# Patient Record
Sex: Female | Born: 1995
Health system: Southern US, Community
[De-identification: ages and names within clinical notes are randomized; demographics above are authoritative.]

## PROBLEM LIST (undated history)

## (undated) DIAGNOSIS — F329 Major depressive disorder, single episode, unspecified: Secondary | ICD-10-CM

## (undated) DIAGNOSIS — F32A Depression, unspecified: Secondary | ICD-10-CM

## (undated) DIAGNOSIS — Z789 Other specified health status: Secondary | ICD-10-CM

## (undated) DIAGNOSIS — O24419 Gestational diabetes mellitus in pregnancy, unspecified control: Secondary | ICD-10-CM

## (undated) DIAGNOSIS — G43009 Migraine without aura, not intractable, without status migrainosus: Secondary | ICD-10-CM

## (undated) DIAGNOSIS — D649 Anemia, unspecified: Secondary | ICD-10-CM

## (undated) HISTORY — PX: TONSILLECTOMY: SUR1361

## (undated) HISTORY — PX: BACK SURGERY: SHX140

## (undated) HISTORY — DX: Gestational diabetes mellitus in pregnancy, unspecified control: O24.419

## (undated) HISTORY — DX: Migraine without aura, not intractable, without status migrainosus: G43.009

## (undated) HISTORY — DX: Anemia, unspecified: D64.9

## (undated) HISTORY — DX: Depression, unspecified: F32.A

---
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
## 2016-08-26 NOTE — L&D Delivery Note (Signed)
Delivery Note At 7:26 PM a viable female was delivered via Vaginal, Spontaneous (Presentation: OA ;  ).  APGAR: 8, 8; weight 4 lb 12.5 oz (2170 g).   Placenta status: c/w abruption, otherwise WNL. Large amount of old clot as fetal shoulders expulsed, .  Cord: 3VC with the following complications: placental abruption s/p PPROM.  Cord pH: sent  Anesthesia:  CLE Episiotomy: None Lacerations:  vaginal Suture Repair: 3.0 vicryl Est. Blood Loss (mL):  700cc, mostly s/s abruption   It's a boy - "Alexis Mullins"! Mom to postpartum.  Baby to NICU.  Alexis Mullins 08/25/2017, 8:11 PM

## 2017-02-11 DIAGNOSIS — Z3201 Encounter for pregnancy test, result positive: Secondary | ICD-10-CM | POA: Diagnosis not present

## 2017-02-19 DIAGNOSIS — N911 Secondary amenorrhea: Secondary | ICD-10-CM | POA: Diagnosis not present

## 2017-02-25 LAB — OB RESULTS CONSOLE ABO/RH: RH TYPE: POSITIVE

## 2017-02-25 LAB — OB RESULTS CONSOLE RPR: RPR: NONREACTIVE

## 2017-02-25 LAB — OB RESULTS CONSOLE HEPATITIS B SURFACE ANTIGEN: HEP B S AG: NEGATIVE

## 2017-02-25 LAB — OB RESULTS CONSOLE RUBELLA ANTIBODY, IGM: RUBELLA: IMMUNE

## 2017-02-25 LAB — OB RESULTS CONSOLE GC/CHLAMYDIA
Chlamydia: NEGATIVE
GC PROBE AMP, GENITAL: NEGATIVE

## 2017-02-25 LAB — OB RESULTS CONSOLE HIV ANTIBODY (ROUTINE TESTING): HIV: NONREACTIVE

## 2017-03-03 DIAGNOSIS — N911 Secondary amenorrhea: Secondary | ICD-10-CM | POA: Diagnosis not present

## 2017-03-07 DIAGNOSIS — Z3480 Encounter for supervision of other normal pregnancy, unspecified trimester: Secondary | ICD-10-CM | POA: Diagnosis not present

## 2017-03-07 DIAGNOSIS — Z3A08 8 weeks gestation of pregnancy: Secondary | ICD-10-CM | POA: Diagnosis not present

## 2017-03-07 DIAGNOSIS — Z3491 Encounter for supervision of normal pregnancy, unspecified, first trimester: Secondary | ICD-10-CM | POA: Diagnosis not present

## 2017-03-25 DIAGNOSIS — Z3401 Encounter for supervision of normal first pregnancy, first trimester: Secondary | ICD-10-CM | POA: Diagnosis not present

## 2017-03-25 DIAGNOSIS — Z113 Encounter for screening for infections with a predominantly sexual mode of transmission: Secondary | ICD-10-CM | POA: Diagnosis not present

## 2017-03-25 DIAGNOSIS — Z3480 Encounter for supervision of other normal pregnancy, unspecified trimester: Secondary | ICD-10-CM | POA: Diagnosis not present

## 2017-05-14 DIAGNOSIS — Z363 Encounter for antenatal screening for malformations: Secondary | ICD-10-CM | POA: Diagnosis not present

## 2017-07-07 DIAGNOSIS — Z3480 Encounter for supervision of other normal pregnancy, unspecified trimester: Secondary | ICD-10-CM | POA: Diagnosis not present

## 2017-07-07 DIAGNOSIS — Z23 Encounter for immunization: Secondary | ICD-10-CM | POA: Diagnosis not present

## 2017-07-07 DIAGNOSIS — Z3A26 26 weeks gestation of pregnancy: Secondary | ICD-10-CM | POA: Diagnosis not present

## 2017-07-21 DIAGNOSIS — Z3A28 28 weeks gestation of pregnancy: Secondary | ICD-10-CM | POA: Diagnosis not present

## 2017-07-21 DIAGNOSIS — O99810 Abnormal glucose complicating pregnancy: Secondary | ICD-10-CM | POA: Diagnosis not present

## 2017-08-24 ENCOUNTER — Encounter (HOSPITAL_COMMUNITY): Payer: Self-pay

## 2017-08-24 ENCOUNTER — Inpatient Hospital Stay (HOSPITAL_COMMUNITY)
Admission: AD | Admit: 2017-08-24 | Discharge: 2017-08-27 | DRG: 805 | Disposition: A | Discharge status: Home / Self Care | Payer: BLUE CROSS/BLUE SHIELD | Source: Ambulatory Visit | Attending: Obstetrics and Gynecology | Admitting: Obstetrics and Gynecology

## 2017-08-24 ENCOUNTER — Other Ambulatory Visit: Payer: Self-pay

## 2017-08-24 DIAGNOSIS — O42913 Preterm premature rupture of membranes, unspecified as to length of time between rupture and onset of labor, third trimester: Principal | ICD-10-CM | POA: Diagnosis present

## 2017-08-24 DIAGNOSIS — O42919 Preterm premature rupture of membranes, unspecified as to length of time between rupture and onset of labor, unspecified trimester: Secondary | ICD-10-CM | POA: Diagnosis present

## 2017-08-24 DIAGNOSIS — O4593 Premature separation of placenta, unspecified, third trimester: Secondary | ICD-10-CM | POA: Diagnosis present

## 2017-08-24 DIAGNOSIS — Z3A33 33 weeks gestation of pregnancy: Secondary | ICD-10-CM

## 2017-08-24 DIAGNOSIS — Z880 Allergy status to penicillin: Secondary | ICD-10-CM | POA: Diagnosis not present

## 2017-08-24 DIAGNOSIS — O42013 Preterm premature rupture of membranes, onset of labor within 24 hours of rupture, third trimester: Secondary | ICD-10-CM | POA: Diagnosis not present

## 2017-08-24 DIAGNOSIS — O24420 Gestational diabetes mellitus in childbirth, diet controlled: Secondary | ICD-10-CM | POA: Diagnosis present

## 2017-08-24 DIAGNOSIS — Z3A32 32 weeks gestation of pregnancy: Secondary | ICD-10-CM | POA: Diagnosis not present

## 2017-08-24 DIAGNOSIS — Z363 Encounter for antenatal screening for malformations: Secondary | ICD-10-CM

## 2017-08-24 DIAGNOSIS — Z3A00 Weeks of gestation of pregnancy not specified: Secondary | ICD-10-CM | POA: Diagnosis not present

## 2017-08-24 HISTORY — DX: Other specified health status: Z78.9

## 2017-08-24 LAB — URINALYSIS, ROUTINE W REFLEX MICROSCOPIC
BILIRUBIN URINE: NEGATIVE
Glucose, UA: NEGATIVE mg/dL
KETONES UR: NEGATIVE mg/dL
LEUKOCYTES UA: NEGATIVE
NITRITE: NEGATIVE
Protein, ur: NEGATIVE mg/dL
RBC / HPF: NONE SEEN RBC/hpf (ref 0–5)
Specific Gravity, Urine: 1.003 — ABNORMAL LOW (ref 1.005–1.030)
pH: 7 (ref 5.0–8.0)

## 2017-08-24 LAB — GLUCOSE, CAPILLARY
GLUCOSE-CAPILLARY: 76 mg/dL (ref 65–99)
GLUCOSE-CAPILLARY: 112 mg/dL — AB (ref 65–99)

## 2017-08-24 LAB — CBC
HEMATOCRIT: 34.1 % — AB (ref 36.0–46.0)
HEMOGLOBIN: 11.3 g/dL — AB (ref 12.0–15.0)
MCH: 27.6 pg (ref 26.0–34.0)
MCHC: 33.1 g/dL (ref 30.0–36.0)
MCV: 83.4 fL (ref 78.0–100.0)
Platelets: 173 10*3/uL (ref 150–400)
RBC: 4.09 MIL/uL (ref 3.87–5.11)
RDW: 12.5 % (ref 11.5–15.5)
WBC: 13.6 10*3/uL — AB (ref 4.0–10.5)

## 2017-08-24 LAB — POCT FERN TEST: POCT Fern Test: NEGATIVE

## 2017-08-24 LAB — WET PREP, GENITAL
Clue Cells Wet Prep HPF POC: NONE SEEN
SPERM: NONE SEEN
WBC, Wet Prep HPF POC: NONE SEEN
YEAST WET PREP: NONE SEEN

## 2017-08-24 LAB — AMNISURE RUPTURE OF MEMBRANE (ROM) NOT AT ARMC: Amnisure ROM: POSITIVE

## 2017-08-24 MED ORDER — AZITHROMYCIN 500 MG PO TABS: 500.0000 mg | ORAL_TABLET | Freq: Every day | ORAL | Status: DC

## 2017-08-24 MED ORDER — ACETAMINOPHEN 325 MG PO TABS: 650.0000 mg | ORAL_TABLET | ORAL | Status: DC | PRN

## 2017-08-24 MED ORDER — DOCUSATE SODIUM 100 MG PO CAPS
100.0000 mg | ORAL_CAPSULE | Freq: Every day | ORAL | Status: DC
  Administered 2017-08-24: 100 mg via ORAL
  Filled 2017-08-24: qty 1

## 2017-08-24 MED ORDER — ERYTHROMYCIN BASE 250 MG PO TABS: 250.0000 mg | ORAL_TABLET | Freq: Four times a day (QID) | ORAL | Status: DC

## 2017-08-24 MED ORDER — SODIUM CHLORIDE 0.9 % IV SOLN: 250.0000 mg | Freq: Four times a day (QID) | INTRAVENOUS | Status: DC

## 2017-08-24 MED ORDER — DEXTROSE 5 % IV SOLN
500.0000 mg | INTRAVENOUS | Status: DC
  Administered 2017-08-24: 500 mg via INTRAVENOUS
  Filled 2017-08-24 (×2): qty 500

## 2017-08-24 MED ORDER — SODIUM CHLORIDE 0.9% FLUSH
3.0000 mL | INTRAVENOUS | Status: DC | PRN
  Administered 2017-08-24: 3 mL via INTRAVENOUS
  Filled 2017-08-24: qty 3

## 2017-08-24 MED ORDER — BETAMETHASONE SOD PHOS & ACET 6 (3-3) MG/ML IJ SUSP
12.0000 mg | INTRAMUSCULAR | Status: AC
  Administered 2017-08-24 – 2017-08-25 (×2): 12 mg via INTRAMUSCULAR
  Filled 2017-08-24 (×2): qty 2

## 2017-08-24 MED ORDER — CALCIUM CARBONATE ANTACID 500 MG PO CHEW: 2.0000 | CHEWABLE_TABLET | ORAL | Status: DC | PRN

## 2017-08-24 MED ORDER — SODIUM CHLORIDE 0.9 % IV SOLN: 250.0000 mL | INTRAVENOUS | Status: DC | PRN

## 2017-08-24 MED ORDER — NIFEDIPINE 10 MG PO CAPS
10.0000 mg | ORAL_CAPSULE | ORAL | Status: DC | PRN
  Administered 2017-08-24 – 2017-08-25 (×2): 10 mg via ORAL
  Filled 2017-08-24 (×2): qty 1

## 2017-08-24 MED ORDER — ZOLPIDEM TARTRATE 5 MG PO TABS
5.0000 mg | ORAL_TABLET | Freq: Every evening | ORAL | Status: DC | PRN
Start: 2017-08-24 — End: 2017-08-25

## 2017-08-24 MED ORDER — PRENATAL MULTIVITAMIN CH
1.0000 | ORAL_TABLET | Freq: Every day | ORAL | Status: DC
  Administered 2017-08-24: 1 via ORAL
  Filled 2017-08-24: qty 1

## 2017-08-24 MED ORDER — SODIUM CHLORIDE 0.9% FLUSH: 3.0000 mL | Freq: Two times a day (BID) | INTRAVENOUS | Status: DC

## 2017-08-24 NOTE — Consult Note (Signed)
[facility]  [facility] 08/24/2017    10:56 PM  Neonatal Medicine Consultation         Kris MoutonBrooke Yore          MRN:  [id number]  I was called at the request of the patient's obstetrician (Dr. Langston MaskerMorris) to speak to this patient due to prematurity and premature ROM at 3432 6/7 weeks (today).  The patient's prenatal course includes A1DM with good control, otherwise she has been doing well until today.  She is 32 6/7 weeks currently.  She is admitted to antenatal unit, and is receiving treatment that includes latency antibiotics, betamethasone, and nifedipine.  GBS testing is pending.  The baby is a boy.  I reviewed expectations for a baby born at 32+ weeks, including survival, length of stay, morbidities such as respiratory distress, infection, feeding intolerance.  I described how we provide respiratory and feeding support.  Mom plans to breast feed, which I encouraged as best for the baby (with supplementations for needed calories).  I let mom know that the baby's outlook generally improves the longer she remains undelivered.  I spent 30 minutes reviewing the record, speaking to the patient, and entering appropriate documentation.  More than 50% of the time was spent face to face with patient.   _____________________ Electronically Signed By: Angelita InglesMcCrae S. Malayshia All, MD Neonatologist

## 2017-08-24 NOTE — Progress Notes (Signed)
Sterile speculum exam done to obtain specimens for wet prep and group B strep. Cervix appears visually closed on speculum exam.

## 2017-08-24 NOTE — MAU Provider Note (Signed)
Chief Complaint:  Rupture of Membranes   First Provider Initiated Contact with Patient 08/24/17 1102      HPI: Alexis Mullins is a 21 y.o. G1P0 at 9264w6d who presents to maternity admissions reporting rupture of membranes. She reports waking up at 430am with underwear soaking wet she changed clothes and went back to sleep, woke up again about an hour later soaking wet again. She reports good fetal movement, vaginal bleeding, vaginal itching/burning, urinary symptoms, h/a, dizziness, n/v, or fever/chills.    Past Medical History: Past Medical History:  Diagnosis Date  . Medical history non-contributory     Past obstetric history: OB History  Gravida Para Term Preterm AB Living  1            SAB TAB Ectopic Multiple Live Births               # Outcome Date GA Lbr Len/2nd Weight Sex Delivery Anes PTL Lv  1 Current               Past Surgical History: Past Surgical History:  Procedure Laterality Date  . BACK SURGERY    . TONSILLECTOMY      Family History: History reviewed. No pertinent family history.  Social History: Social History   Tobacco Use  . Smoking status: Never Smoker  . Smokeless tobacco: Never Used  Substance Use Topics  . Alcohol use: No    Frequency: Never  . Drug use: No    Allergies:  Allergies  Allergen Reactions  . Penicillins Hives and Swelling    Has patient had a PCN reaction causing immediate rash, facial/tongue/throat swelling, SOB or lightheadedness with hypotension: Yes Has patient had a PCN reaction causing severe rash involving mucus membranes or skin necrosis: Yes Has patient had a PCN reaction that required hospitalization: No Has patient had a PCN reaction occurring within the last 10 years: No If all of the above answers are "NO", then may proceed with Cephalosporin use.     Meds:  Medications Prior to Admission  Medication Sig Dispense Refill Last Dose  . Prenatal Vit-Fe Fumarate-FA (PRENATAL MULTIVITAMIN) TABS tablet Take 1  tablet by mouth daily at 12 noon.   08/23/2017 at Unknown time    ROS:  Review of Systems  Constitutional: Negative.   Respiratory: Negative.   Cardiovascular: Negative.   Gastrointestinal: Negative.   Genitourinary: Negative for difficulty urinating, dysuria, frequency, pelvic pain, urgency, vaginal bleeding, vaginal discharge and vaginal pain.       Leaking fluid   Musculoskeletal: Negative.   Skin: Negative.   Neurological: Negative.   Psychiatric/Behavioral: Negative.    I have reviewed patient's Past Medical Hx, Surgical Hx, Family Hx, Social Hx, medications and allergies.   Physical Exam   Patient Vitals for the past 24 hrs:  BP Temp Temp src Pulse Resp SpO2  08/24/17 1153 115/69 98.1 F (36.7 C) Oral (!) 112 17 95 %  08/24/17 1040 118/77 98.5 F (36.9 C) Oral (!) 111 18 -   Constitutional: Well-developed, well-nourished female in no acute distress.  Cardiovascular: normal rate Respiratory: normal effort GI: Abd soft, non-tender, gravid appropriate for gestational age.  MS: Extremities nontender, no edema, normal ROM Neurologic: Alert and oriented x 4.  GU: Neg CVAT.  Cervical exam: Dilation: Closed(fingertip externally) Effacement (%): Thick Cervical Position: Posterior Station: -3 Exam by:: v Will Schier cnm    FHT:  Baseline 135 , moderate variability, accelerations present, no decelerations Contractions: 2 UC/ mild with UI  Labs: Results for orders placed or performed during the hospital encounter of 08/24/17 (from the past 24 hour(s))  Amnisure rupture of membrane (rom)not at [facility]     Status: None   Collection Time: 08/24/17 10:51 AM  Result Value Ref Range   Amnisure ROM POSITIVE   POCT fern test     Status: None   Collection Time: 08/24/17 11:07 AM  Result Value Ref Range   POCT Fern Test Negative = intact amniotic membranes      MAU Course/MDM: Orders Placed This Encounter  Procedures  . Amnisure [facility] (rom[facility]not [facility]  .  Encourage fluids  . POCT fern test    Meds ordered this encounter  Medications  . NIFEdipine (PROCARDIA) capsule 10 mg  . betamethasone acetate-betamethasone sodium phosphate (CELESTONE) injection 12 mg   NST reviewed Consult Dr Langston MaskerMorris with presentation, exam findings and test results.  Treatments in MAU included one dose of Procardia 10mg  and oral pitcher of fluid.  NICU called for admission notification- Dr Eulah PontMurphy agrees with POC and [facility]   Assessment: 1. Preterm premature rupture of membranes (PPROM) with unknown onset of labor    Plan: Admission to Ante, care transferred to Dr Langston MaskerMorris Orders placed by Dr Langston MaskerMorris    Steward DroneVeronica Aaliyan Brinkmeier Certified Nurse-Midwife 08/24/2017 11:12 AM

## 2017-08-24 NOTE — H&P (Addendum)
Alexis MoutonBrooke Mullins is a 21 y.o. female presenting for PPROM at 4am today; amnisure positive in MAU.  She patient reports clear fluid.  Active FM.  No VB or CTX. CVX closed. Antepartum course complicated by A1DM with good control.  The patient has h/o anemia requiring blood transfusion; stable with pregnancy.    OB History    Gravida Para Term Preterm AB Living   1             SAB TAB Ectopic Multiple Live Births                 Past Medical History:  Diagnosis Date  . Medical history non-contributory    Past Surgical History:  Procedure Laterality Date  . BACK SURGERY    . TONSILLECTOMY     Family History: family history is not on file. Social History:  reports that  has never smoked. she has never used smokeless tobacco. She reports that she does not drink alcohol or use drugs.     Maternal Diabetes: Yes:  Diabetes Type:  Diet controlled Genetic Screening: Normal Maternal Ultrasounds/Referrals: Normal Fetal Ultrasounds or other Referrals:  None Maternal Substance Abuse:  No Significant Maternal Medications:  None Significant Maternal Lab Results:  None Other Comments:  None  ROS Maternal Medical History:  Reason for admission: Rupture of membranes.   Contractions: Frequency: rare.   Perceived severity is mild.    Fetal activity: Perceived fetal activity is normal.   Last perceived fetal movement was within the past hour.    Prenatal complications: no prenatal complications Prenatal Complications - Diabetes: gestational. Diabetes is managed by diet.      Dilation: Closed(fingertip externally) Effacement (%): Thick Station: -3 Exam by:: v rogers cnm Blood pressure 115/69, pulse (!) 112, temperature 98.1 F (36.7 C), temperature source Oral, resp. rate 17, SpO2 95 %. Maternal Exam:  Uterine Assessment: Contraction strength is mild.  Contraction frequency is rare.   Abdomen: Fundal height is c/w dates.       Physical Exam  Constitutional: She is oriented to  person, place, and time. She appears well-developed and well-nourished.  GI: Soft. There is no tenderness. There is no rebound and no guarding.  Neurological: She is alert and oriented to person, place, and time.  Skin: Skin is warm and dry.  Psychiatric: She has a normal mood and affect. Her behavior is normal.    Prenatal labs: ABO, Rh:   Antibody:   Rubella:   RPR:    HBsAg:    HIV:    GBS:     Assessment/Plan: 21yo G1 at 5125w6d with PPROM -Latency antibiotics (Azith only; allergy to PCN) -BMZ -GBS, GC/C, urinalysis pending -SCDs -MFM ultrasound -NICU consult -Monitor for s/sx chorio, labor, abruption -CEFM x 24 hours  Alexis Mullins 08/24/2017, 2:10 PM

## 2017-08-24 NOTE — Progress Notes (Addendum)
2115: Dr Katrinka BlazingSmith at bedside communicating with patient and her spouse. All questions were answered. Pt pass a small clot with leaking.  0115: Dr Langston MaskerMorris called and informed of patient's vaginal bleed with a few small clots, the largest clot approximately 1/2 dollar size. FHR 120-110 with good excels and occasional contractions which is sometimes felt and other times not felt. Order received to start LR @75  mls/hr and call MD if anything changes.   0138: LR 250 ml bolus administered as ordered.  [id number]: Procardia 10 mg po administered for preterm contractions.   0630: No new vaginal bleed noted or reported. Contraction decrease and patient reported 'feeling ok". Dr Langston MaskerMorris called and was updated.  0700: Pt off unit for MFM US.

## 2017-08-24 NOTE — MAU Note (Signed)
Woke up during the night and states underwear was soaked. Changed and went back to bed- underwear soaked through again. Clear fluid with pink tinge. Some clear mucous. Feeling some cramping. Some decreased movement today

## 2017-08-25 ENCOUNTER — Inpatient Hospital Stay (HOSPITAL_COMMUNITY): Payer: BLUE CROSS/BLUE SHIELD | Admitting: Anesthesiology

## 2017-08-25 ENCOUNTER — Encounter (HOSPITAL_COMMUNITY): Payer: Self-pay | Admitting: Obstetrics and Gynecology

## 2017-08-25 ENCOUNTER — Inpatient Hospital Stay (HOSPITAL_COMMUNITY): Payer: BLUE CROSS/BLUE SHIELD

## 2017-08-25 DIAGNOSIS — Z363 Encounter for antenatal screening for malformations: Secondary | ICD-10-CM

## 2017-08-25 DIAGNOSIS — Z3A33 33 weeks gestation of pregnancy: Secondary | ICD-10-CM

## 2017-08-25 LAB — CBC
HCT: 32.1 % — ABNORMAL LOW (ref 36.0–46.0)
HEMOGLOBIN: 10.5 g/dL — AB (ref 12.0–15.0)
MCH: 27.4 pg (ref 26.0–34.0)
MCHC: 32.7 g/dL (ref 30.0–36.0)
MCV: 83.8 fL (ref 78.0–100.0)
Platelets: 166 10*3/uL (ref 150–400)
RBC: 3.83 MIL/uL — ABNORMAL LOW (ref 3.87–5.11)
RDW: 12.4 % (ref 11.5–15.5)
WBC: 19.7 10*3/uL — ABNORMAL HIGH (ref 4.0–10.5)

## 2017-08-25 LAB — PLATELET COUNT: PLATELETS: 171 10*3/uL (ref 150–400)

## 2017-08-25 LAB — GC/CHLAMYDIA PROBE AMP (~~LOC~~) NOT AT ARMC
CHLAMYDIA, DNA PROBE: NEGATIVE
NEISSERIA GONORRHEA: NEGATIVE

## 2017-08-25 LAB — GLUCOSE, CAPILLARY
GLUCOSE-CAPILLARY: 100 mg/dL — AB (ref 65–99)
GLUCOSE-CAPILLARY: 115 mg/dL — AB (ref 65–99)
GLUCOSE-CAPILLARY: 97 mg/dL (ref 65–99)
Glucose-Capillary: 127 mg/dL — ABNORMAL HIGH (ref 65–99)

## 2017-08-25 LAB — PROTIME-INR
INR: 1.08
Prothrombin Time: 13.9 seconds (ref 11.4–15.2)

## 2017-08-25 LAB — APTT: aPTT: 27 seconds (ref 24–36)

## 2017-08-25 LAB — SAVE SMEAR

## 2017-08-25 LAB — FIBRINOGEN: Fibrinogen: 478 mg/dL — ABNORMAL HIGH (ref 210–475)

## 2017-08-25 MED ORDER — WITCH HAZEL-GLYCERIN EX PADS
1.0000 | MEDICATED_PAD | CUTANEOUS | Status: DC | PRN
Start: 2017-08-25 — End: 2017-08-27

## 2017-08-25 MED ORDER — LIDOCAINE HCL (PF) 1 % IJ SOLN
30.0000 mL | INTRAMUSCULAR | Status: DC | PRN
  Filled 2017-08-25: qty 30

## 2017-08-25 MED ORDER — COCONUT OIL OIL: 1.0000 "application " | TOPICAL_OIL | Status: DC | PRN

## 2017-08-25 MED ORDER — OXYCODONE-ACETAMINOPHEN 5-325 MG PO TABS: 1.0000 | ORAL_TABLET | ORAL | Status: DC | PRN

## 2017-08-25 MED ORDER — SIMETHICONE 80 MG PO CHEW: 80.0000 mg | CHEWABLE_TABLET | ORAL | Status: DC | PRN

## 2017-08-25 MED ORDER — OXYTOCIN 40 UNITS IN LACTATED RINGERS INFUSION - SIMPLE MED
1.0000 m[IU]/min | INTRAVENOUS | Status: DC
  Administered 2017-08-25: 2 m[IU]/min via INTRAVENOUS
  Filled 2017-08-25: qty 1000

## 2017-08-25 MED ORDER — PRENATAL MULTIVITAMIN CH
1.0000 | ORAL_TABLET | Freq: Every day | ORAL | Status: DC
  Administered 2017-08-26: 1 via ORAL
  Filled 2017-08-25: qty 1

## 2017-08-25 MED ORDER — LACTATED RINGERS IV SOLN: INTRAVENOUS | Status: DC

## 2017-08-25 MED ORDER — EPHEDRINE 5 MG/ML INJ
10.0000 mg | INTRAVENOUS | Status: DC | PRN
  Filled 2017-08-25: qty 2

## 2017-08-25 MED ORDER — VANCOMYCIN HCL IN DEXTROSE 1-5 GM/200ML-% IV SOLN
1000.0000 mg | Freq: Two times a day (BID) | INTRAVENOUS | Status: DC
  Administered 2017-08-25 – 2017-08-26 (×2): 1000 mg via INTRAVENOUS
  Filled 2017-08-25 (×4): qty 200

## 2017-08-25 MED ORDER — FLEET ENEMA 7-19 GM/118ML RE ENEM: 1.0000 | ENEMA | RECTAL | Status: DC | PRN

## 2017-08-25 MED ORDER — LACTATED RINGERS IV SOLN
INTRAVENOUS | Status: DC
  Administered 2017-08-25 (×2): via INTRAVENOUS

## 2017-08-25 MED ORDER — ONDANSETRON HCL 4 MG PO TABS: 4.0000 mg | ORAL_TABLET | ORAL | Status: DC | PRN

## 2017-08-25 MED ORDER — ONDANSETRON HCL 4 MG/2ML IJ SOLN: 4.0000 mg | Freq: Four times a day (QID) | INTRAMUSCULAR | Status: DC | PRN

## 2017-08-25 MED ORDER — BENZOCAINE-MENTHOL 20-0.5 % EX AERO
1.0000 "application " | INHALATION_SPRAY | CUTANEOUS | Status: DC | PRN
  Administered 2017-08-26: 1 via TOPICAL
  Filled 2017-08-25: qty 56

## 2017-08-25 MED ORDER — DIPHENHYDRAMINE HCL 25 MG PO CAPS: 25.0000 mg | ORAL_CAPSULE | Freq: Four times a day (QID) | ORAL | Status: DC | PRN

## 2017-08-25 MED ORDER — OXYCODONE HCL 5 MG PO TABS: 10.0000 mg | ORAL_TABLET | ORAL | Status: DC | PRN

## 2017-08-25 MED ORDER — PHENYLEPHRINE 40 MCG/ML (10ML) SYRINGE FOR IV PUSH (FOR BLOOD PRESSURE SUPPORT)
80.0000 ug | PREFILLED_SYRINGE | INTRAVENOUS | Status: DC | PRN
  Filled 2017-08-25: qty 5
  Filled 2017-08-25: qty 10

## 2017-08-25 MED ORDER — LACTATED RINGERS IV SOLN: 500.0000 mL | INTRAVENOUS | Status: DC | PRN

## 2017-08-25 MED ORDER — FENTANYL CITRATE (PF) 100 MCG/2ML IJ SOLN
100.0000 ug | Freq: Once | INTRAMUSCULAR | Status: AC
  Administered 2017-08-25: 100 ug via INTRAVENOUS
  Filled 2017-08-25: qty 2

## 2017-08-25 MED ORDER — TETANUS-DIPHTH-ACELL PERTUSSIS 5-2.5-18.5 LF-MCG/0.5 IM SUSP: 0.5000 mL | Freq: Once | INTRAMUSCULAR | Status: DC

## 2017-08-25 MED ORDER — OXYTOCIN BOLUS FROM INFUSION
500.0000 mL | Freq: Once | INTRAVENOUS | Status: AC
  Administered 2017-08-25: 500 mL via INTRAVENOUS

## 2017-08-25 MED ORDER — ZOLPIDEM TARTRATE 5 MG PO TABS: 5.0000 mg | ORAL_TABLET | Freq: Every evening | ORAL | Status: DC | PRN

## 2017-08-25 MED ORDER — ACETAMINOPHEN 325 MG PO TABS: 650.0000 mg | ORAL_TABLET | ORAL | Status: DC | PRN

## 2017-08-25 MED ORDER — TERBUTALINE SULFATE 1 MG/ML IJ SOLN: 0.2500 mg | Freq: Once | INTRAMUSCULAR | Status: DC | PRN

## 2017-08-25 MED ORDER — OXYCODONE HCL 5 MG PO TABS: 5.0000 mg | ORAL_TABLET | ORAL | Status: DC | PRN

## 2017-08-25 MED ORDER — SOD CITRATE-CITRIC ACID 500-334 MG/5ML PO SOLN: 30.0000 mL | ORAL | Status: DC | PRN

## 2017-08-25 MED ORDER — LACTATED RINGERS IV SOLN
500.0000 mL | Freq: Once | INTRAVENOUS | Status: AC
  Administered 2017-08-25: 500 mL via INTRAVENOUS

## 2017-08-25 MED ORDER — SENNOSIDES-DOCUSATE SODIUM 8.6-50 MG PO TABS
2.0000 | ORAL_TABLET | ORAL | Status: DC
  Administered 2017-08-25: 2 via ORAL
  Filled 2017-08-25 (×2): qty 2

## 2017-08-25 MED ORDER — FENTANYL 2.5 MCG/ML BUPIVACAINE 1/10 % EPIDURAL INFUSION (WH - ANES)
14.0000 mL/h | INTRAMUSCULAR | Status: DC | PRN
  Administered 2017-08-25: 14 mL/h via EPIDURAL
  Filled 2017-08-25: qty 100

## 2017-08-25 MED ORDER — LACTATED RINGERS IV BOLUS (SEPSIS)
250.0000 mL | Freq: Once | INTRAVENOUS | Status: AC
  Administered 2017-08-25: 1000 mL via INTRAVENOUS

## 2017-08-25 MED ORDER — FENTANYL CITRATE (PF) 100 MCG/2ML IJ SOLN
100.0000 ug | Freq: Once | INTRAMUSCULAR | Status: AC
  Administered 2017-08-25: 100 ug via INTRAVENOUS

## 2017-08-25 MED ORDER — PHENYLEPHRINE 40 MCG/ML (10ML) SYRINGE FOR IV PUSH (FOR BLOOD PRESSURE SUPPORT)
80.0000 ug | PREFILLED_SYRINGE | INTRAVENOUS | Status: DC | PRN
  Filled 2017-08-25: qty 5

## 2017-08-25 MED ORDER — IBUPROFEN 600 MG PO TABS
600.0000 mg | ORAL_TABLET | Freq: Four times a day (QID) | ORAL | Status: DC
  Administered 2017-08-25 – 2017-08-27 (×6): 600 mg via ORAL
  Filled 2017-08-25 (×6): qty 1

## 2017-08-25 MED ORDER — OXYCODONE-ACETAMINOPHEN 5-325 MG PO TABS: 2.0000 | ORAL_TABLET | ORAL | Status: DC | PRN

## 2017-08-25 MED ORDER — DEXTROSE IN LACTATED RINGERS 5 % IV SOLN
INTRAVENOUS | Status: DC
  Administered 2017-08-25: via INTRAVENOUS

## 2017-08-25 MED ORDER — DIPHENHYDRAMINE HCL 50 MG/ML IJ SOLN: 12.5000 mg | INTRAMUSCULAR | Status: DC | PRN

## 2017-08-25 MED ORDER — ONDANSETRON HCL 4 MG/2ML IJ SOLN: 4.0000 mg | INTRAMUSCULAR | Status: DC | PRN

## 2017-08-25 MED ORDER — FENTANYL CITRATE (PF) 100 MCG/2ML IJ SOLN
INTRAMUSCULAR | Status: AC
  Filled 2017-08-25: qty 2

## 2017-08-25 MED ORDER — OXYTOCIN 40 UNITS IN LACTATED RINGERS INFUSION - SIMPLE MED
2.5000 [IU]/h | INTRAVENOUS | Status: DC
  Administered 2017-08-25: 2.5 [IU]/h via INTRAVENOUS

## 2017-08-25 MED ORDER — LIDOCAINE HCL (PF) 1 % IJ SOLN
INTRAMUSCULAR | Status: DC | PRN
  Administered 2017-08-25: 5 mL
  Administered 2017-08-25: 3 mL
  Administered 2017-08-25: 2 mL

## 2017-08-25 MED ORDER — ACETAMINOPHEN 325 MG PO TABS
650.0000 mg | ORAL_TABLET | ORAL | Status: DC | PRN
Start: 2017-08-25 — End: 2017-08-27

## 2017-08-25 MED ORDER — DIBUCAINE 1 % RE OINT: 1.0000 "application " | TOPICAL_OINTMENT | RECTAL | Status: DC | PRN

## 2017-08-25 NOTE — Anesthesia Procedure Notes (Signed)
Epidural Patient location during procedure: OB Start time: 08/25/2017 5:28 PM End time: 08/25/2017 5:35 PM  Staffing Anesthesiologist: Marcene DuosFitzgerald, Shayra Anton, MD Performed: anesthesiologist   Preanesthetic Checklist Completed: patient identified, site marked, surgical consent, pre-op evaluation, timeout performed, IV checked, risks and benefits discussed and monitors and equipment checked  Epidural Patient position: sitting Prep: site prepped and draped and DuraPrep Patient monitoring: continuous pulse ox and blood pressure Approach: midline Location: L3-L4 Injection technique: LOR air  Needle:  Needle type: Tuohy  Needle gauge: 17 G Needle length: 9 cm and 9 Needle insertion depth: 4 cm Catheter type: closed end flexible Catheter size: 19 Gauge Catheter at skin depth: 9 cm Test dose: negative  Assessment Events: blood not aspirated, injection not painful, no injection resistance, negative IV test and no paresthesia  Additional Notes LOR to air at 4 cm. Dural puncture with 25G pencan through epidural needle performed for attempt at superior analgesia in pt with previous extensive scoliosis surgery. CSF obtained and needle removed. Easy thread of catheter. Taped and secured. Tolerated procedure well. Small amount of pressure sensation with bolus injection LA. No paresthesia, no resistance.

## 2017-08-25 NOTE — Progress Notes (Signed)
Dr. Velvet BatheLedger notified of frequent contractions that patient does not feel and moderate vaginal bleeding.  Dr. Velvet BatheLedger  On her way to access patient.  Continue to monitor.

## 2017-08-25 NOTE — Progress Notes (Signed)
Patient transported to labor and delivery.

## 2017-08-25 NOTE — Progress Notes (Signed)
Starting to feel contractions.  Continues to have vaginal bleeding but it is downtrending. Last pad changed >1 hours ago. Quarter size old blood at tip of pad, otherwise no active VB.  Hgb 11.2 --> 10.5 but DIC panel WNL. Continues to not have any s/s of anemia.  S/p Vanc for GBS proph. Prelim cx w/out growth which is reassuring, however given preliminary will cont vanc until final.  FWB remains reassuring - no decels. Encouraged patient to get cle given clinical picture of abruption - at higher risk of requiring emergent CS s/s fetal distress.  Continue pitocin.   Rosie FateE Tannia Contino MD

## 2017-08-25 NOTE — Anesthesia Pain Management Evaluation Note (Signed)
  CRNA Pain Management Visit Note  Patient: Alexis Mullins, [age] y.o., female  "Hello I am a [facility] of the anesthesia team at [facility]. We have an anesthesia team available at all times to provide care throughout the hospital, including epidural management and anesthesia for C-section. I don't know your plan for the delivery whether it a natural birth, water birth, IV sedation, nitrous supplementation, doula or epidural, but we want to meet your pain goals."   1.Was your pain managed to your expectations on prior hospitalizations?   No prior hospitalizations  2.What is your expectation for pain management during this hospitalization?     Labor support without medications  3.How can we help you reach that goal? natural  Record the patient's initial score and the patient's pain goal.   Pain: 6  Pain Goal: 7 [facility] wants you to be able to say your pain was always managed very well.  Alexis Mullins 08/25/2017

## 2017-08-25 NOTE — Progress Notes (Addendum)
S: Pt reports moderate vaginal bleeding w/cont LOF. Occ tightening but not feeling overt ctxn. No fevers/chills/dizziness. Has active FM.  O:  Vitals:   08/25/17 0329 08/25/17 0830  BP: 106/65 (!) 102/55  Pulse: 75 86  Resp: 14 15  Temp: 97.7 F (36.5 C) (!) 97.5 F (36.4 C)  SpO2: 99% 99%   Gen: well appearing, NAD Abd: soft, gravid, fundus firm when contracting GYN: moderate amnt of blood, quarter size clot cleared from vagina SVE: 2/c/0, vertex  FHT cat 1 tracing Toco q 1-2 min  A/P: 21yo G1P0 @ 33.0 wga with PPROM now with cervical change to 2cm and with moderate vaginal bleeding c/f placental abruption vs quick cervical change. Prelim US w/out overt abruption visualized. However, clinically with clots and moderate bleeding. Previously counseled regarding risks of expectant manag't and understands labor + abruption are both contraindications to expectant management. I recommended to proceed with delivery. FWB is reassuring - no decels and + accels. No evidence of infection. Patient and husband are in agreement with this plan.   #PPROM + IOL - contracting q 1-2 min now, changed cervix to 2cm. Expectant manag't for now but anticipate will need pitocin after GBS proph given - d/c azithro and change to vanc for GBS proph - CBC + DIC panel stat - currently HDS and w/out evidence of life-threatening VB, last hgb >11 on admission  # FWB - reassuring.  - s/p BMZ x 2 - s/p NICU c/s - vertex  # MWB - h/o anemia, last hgb >11 yesterday - A1GDM - DM protocol on L&D - otherwise no sig med/surg issues  # GBS - unknown, vanc as above for proph given PCN allergic  # Dispo - transfer to L&D   Belva AgeeElise Joycelynn Fritsche MD

## 2017-08-25 NOTE — Progress Notes (Signed)
Dr Ledger at bedside.  

## 2017-08-26 LAB — CBC
HCT: 27.5 % — ABNORMAL LOW (ref 36.0–46.0)
HEMOGLOBIN: 9.2 g/dL — AB (ref 12.0–15.0)
MCH: 27.7 pg (ref 26.0–34.0)
MCHC: 33.5 g/dL (ref 30.0–36.0)
MCV: 82.8 fL (ref 78.0–100.0)
PLATELETS: 169 10*3/uL (ref 150–400)
RBC: 3.32 MIL/uL — ABNORMAL LOW (ref 3.87–5.11)
RDW: 12.8 % (ref 11.5–15.5)
WBC: 21.8 10*3/uL — ABNORMAL HIGH (ref 4.0–10.5)

## 2017-08-26 LAB — GLUCOSE, CAPILLARY: GLUCOSE-CAPILLARY: 98 mg/dL (ref 65–99)

## 2017-08-26 LAB — RPR: RPR: NONREACTIVE

## 2017-08-26 LAB — CULTURE, BETA STREP (GROUP B ONLY)

## 2017-08-26 NOTE — Lactation Note (Signed)
This note was copied from a baby's chart. Lactation Consultation Note  Patient Name: Alexis Kris MoutonBrooke Candella NFAOZ'HToday's Date: 08/26/2017 Baby at 15 hr of life. Mom was in the NICU at this visit. RN to call for lactation when mom returns to the room and ready to start pumping.    Rulon Eisenmengerlizabeth E Nikeia Henkes 08/26/2017, 11:23 AM

## 2017-08-26 NOTE — Anesthesia Postprocedure Evaluation (Signed)
Anesthesia Post Note  Patient: Alexis Mullins  Procedure(s) Performed: AN AD HOC LABOR EPIDURAL     Patient location during evaluation: Women's Unit Anesthesia Type: Epidural Level of consciousness: awake and alert and oriented Pain management: pain level controlled Vital Signs Assessment: post-procedure vital signs reviewed and stable Respiratory status: spontaneous breathing and nonlabored ventilation Cardiovascular status: stable Postop Assessment: no headache, adequate PO intake, no backache, epidural receding, patient able to bend at knees and no apparent nausea or vomiting Anesthetic complications: no    Last Vitals:  Vitals:   08/26/17 0340 08/26/17 0828  BP: (!) 99/50 103/60  Pulse:  74  Resp: 16 16  Temp: 37.5 C 36.7 C  SpO2: 100% 100%    Last Pain:  Vitals:   08/26/17 0835  TempSrc:   PainSc: 0-No pain   Pain Goal: Patients Stated Pain Goal: 5(relief) (08/26/17 0700)               Laban EmperorMalinova,[name]

## 2017-08-26 NOTE — Progress Notes (Signed)
Post Partum Day 1 Subjective: no complaints  Objective: Blood pressure 103/60, pulse 74, temperature 98 F (36.7 C), temperature source Oral, resp. rate 16, height 5\' 2"  (1.575 m), weight 133 lb 0.1 oz (60.3 kg), SpO2 100 %, unknown if currently breastfeeding.  Physical Exam:  General: alert Lochia: appropriate Uterine Fundus: firm Incision: healing well DVT Evaluation: No evidence of DVT seen on physical exam.  Recent Labs    08/25/17 0956 08/26/17 0601  HGB 10.5* 9.2*  HCT 32.1* 27.5*    Assessment/Plan: Plan for discharge tomorrow  Baby on RA in NICU   LOS: 2 days   Alexis Mullins 08/26/2017, 9:51 AM

## 2017-08-26 NOTE — Progress Notes (Signed)
Pts husband came to desk and asked for me to come assess a clot on pts pad while pt was in shower. Pt did pass a medium sized clot, approx ping-pong ball size. I advised pt & her SO to call out if she passes one that is this size or larger again, or if she passes many more. I explained that passing some small clots, particularly when she stands after sitting for a while, is normal. Reassessment at approximately 1230, pt had very small, less than dime sized clot, but bleeding remains well controlled. Sheryn BisonGordon, Demetri Kerman Warner

## 2017-08-27 LAB — GLUCOSE, CAPILLARY: Glucose-Capillary: 82 mg/dL (ref 65–99)

## 2017-08-27 NOTE — Anesthesia Preprocedure Evaluation (Signed)

## 2017-08-27 NOTE — Discharge Summary (Signed)
Obstetric Discharge Summary Reason for Admission: PPROM @ 32.6wga Prenatal Procedures: ultrasound and BMZ, BPPs. Clinical abruption/PTL --> IOL Intrapartum Procedures: spontaneous vaginal delivery Postpartum Procedures: none Complications-Operative and Postpartum: none Hemoglobin  Date Value Ref Range Status  08/26/2017 9.2 (L) 12.0 - 15.0 g/dL Final   HCT  Date Value Ref Range Status  08/26/2017 27.5 (L) 36.0 - 46.0 % Final    Physical Exam:  General: alert, cooperative and appears stated age 51Lochia: appropriate Uterine Fundus: firm Incision: healing well, no significant drainage, no dehiscence, no significant erythema DVT Evaluation: No evidence of DVT seen on physical exam. Negative Homan's sign. No cords or calf tenderness. No significant calf/ankle edema.  Discharge Diagnoses: PPROM c/b abruption w/PTL. Now s/p NSVD  Discharge Information: Date: 08/27/2017 Activity: pelvic rest Diet: routine Medications: None Condition: stable Instructions: refer to practice specific booklet Discharge to: home   Newborn Data: Live born female  Birth Weight: 4 lb 12.5 oz (2170 g) APGAR: 8, 8  Newborn Delivery   Birth date/time:  08/25/2017 19:26:00 Delivery type:  Vaginal, Spontaneous     Home with NICU.  Ranae Pilalise Jennifer Ren Aspinall 08/27/2017, 9:39 AM

## 2017-08-27 NOTE — Lactation Note (Signed)
This note was copied from a baby's chart. Lactation Consultation Note  Patient Name: Boy Kris MoutonBrooke Milbrath ZOXWR'UToday's Date: 08/27/2017 Reason for consult: Follow-up assessment   Baby in NICU 5439 hours old.  Mother is being discharged today. Recommend mother pump q 3 hours w/ DEBP. Suggest she hand express before and after pumping. Offered to demonstrate how to hand express but mother refused. Discussed milk storage, transport and recommend family read NICU breastfeeding booklet. Provided mother w/ manual pump and reviewed use. Mother states [facility] [place]. She wants to cancel appt.  LC suggests she get DEBP today. Family states friends are giving family new "My Pump" breast pump. LC unfamilar with brand and suggest she contact WIC. Discussed using pumping rooms in NICU. Mother seemed rushed to leave during consult. Reviewed engorgement care.      Maternal Data    Feeding    LATCH Score                   Interventions    Lactation Tools Discussed/Used     Consult Status Consult Status: Follow-up Date: 08/28/17 Follow-up type: In-patient    Dahlia ByesBerkelhammer, [facility] 08/27/2017, 11:01 AM

## 2017-08-27 NOTE — Progress Notes (Signed)
Patient given discharge instructions, PIH s/s reviewed, Baby and ME book given and reviewed with patient. Patient denies any complaints at this time. Patient to pump and then leave and visit NICU one last time.

## 2017-08-28 LAB — TYPE AND SCREEN
ABO/RH(D): A POS
ANTIBODY SCREEN: POSITIVE
DONOR AG TYPE: NEGATIVE
DONOR AG TYPE: NEGATIVE
PT AG Type: NEGATIVE
Unit division: 0
Unit division: 0

## 2017-08-28 LAB — BPAM RBC
BLOOD PRODUCT EXPIRATION DATE: 201901302359
Blood Product Expiration Date: 201901092359
UNIT TYPE AND RH: 5100
UNIT TYPE AND RH: 6200

## 2017-10-06 DIAGNOSIS — Z1389 Encounter for screening for other disorder: Secondary | ICD-10-CM | POA: Diagnosis not present

## 2018-05-03 IMAGING — US US MFM OB DETAIL+14 WK
1 series · 14 of 28 positions shown · non-contrast
Comparison: none

[REDACTED]. [HOSPITAL]
DO

1  SORIN OXENDINE             [id number]      [id number]     [id number]
Indications
33 weeks gestation of pregnancy
Encounter for antenatal screening for
malformations
Premature rupture of membranes - leaking
fluid (+Amnisuer, -Fern)
OB History
Gravidity:    1
Fetal Evaluation
Num Of Fetuses:     1
Fetal Heart         123
Rate(bpm):
Cardiac Activity:   Observed
Presentation:       Cephalic
Placenta:           Anterior, above cervical os
P. Cord Insertion:  Not well visualized
Amniotic Fluid
AFI FV:      Subjectively within normal limits
AFI Sum(cm)     %Tile       Largest Pocket(cm)
13.33           43
RUQ(cm)                     LUQ(cm)        LLQ(cm)
6.34
Biometry
BPD:      82.2  mm     G. Age:  33w 1d         45  %    CI:        70.39   %    70 - 86
FL/HC:      19.9   %    19.9 -
HC:      312.4  mm     G. Age:  35w 0d         65  %    HC/AC:      0.99        0.96 -
AC:      316.1  mm     G. Age:  35w 4d       > 97  %    FL/BPD:     75.7   %    71 - 87
FL:       62.2  mm     G. Age:  32w 1d         20  %    FL/AC:      19.7   %    20 - 24
HUM:      56.6  mm     G. Age:  32w 6d         57  %
Est. FW:    4650  gm      5 lb 5 oz     80  %
Gestational Age
LMP:           34w 1d        Date:  12/29/16                 EDD:   10/05/17
Clinical EDD:  33w 0d                                        EDD:   10/13/17
U/S Today:     34w 0d                                        EDD:   10/06/17
Best:          33w 0d     Det. By:  Clinical EDD             EDD:   10/13/17
Anatomy
Cranium:               Appears normal         Aortic Arch:            Not well visualized
Cavum:                 Not well visualized    Ductal Arch:            Appears normal
Ventricles:            Appears normal         Diaphragm:              Appears normal
Choroid Plexus:        Appears normal         Stomach:                Appears normal, left
sided
Cerebellum:            Not well visualized    Abdomen:                Appears normal
Posterior Fossa:       Not well visualized    Abdominal Wall:         Not well visualized
Nuchal Fold:           Not applicable (>20    Cord Vessels:           Appears normal (3
wks GA)                                        vessel cord)
Face:                  Orbits nl; profile not Kidneys:                Appear normal
well visualized
Lips:                  Appears normal         Bladder:                Appears normal
Thoracic:              Appears normal         Spine:                  Not well visualized
Heart:                 Not well visualized    Upper Extremities:      Visualized; hands
nws
RVOT:                  Appears normal         Lower Extremities:      Visualized; ankles
LVOT:                  Appears normal
Cervix Uterus Adnexa
Cervix
Not visualized (advanced GA >88wks)
Uterus
No abnormality visualized.
Left Ovary
Not visualized.
Right Ovary
Adnexa:       No abnormality visualized. No adnexal mass
visualized.
Impression
INDICATION: 21 yr old G1P0 at 44w2d with PPROM for fetal
ultrasound. Remote read.

[Series 1: us mfm ob detail+14 wk · 98 acquisitions, 14 frames shown]
[im 4/98]
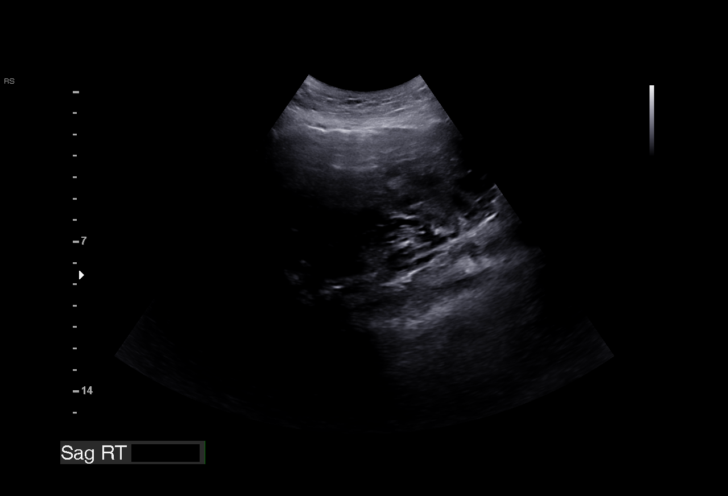
[im 11/98]
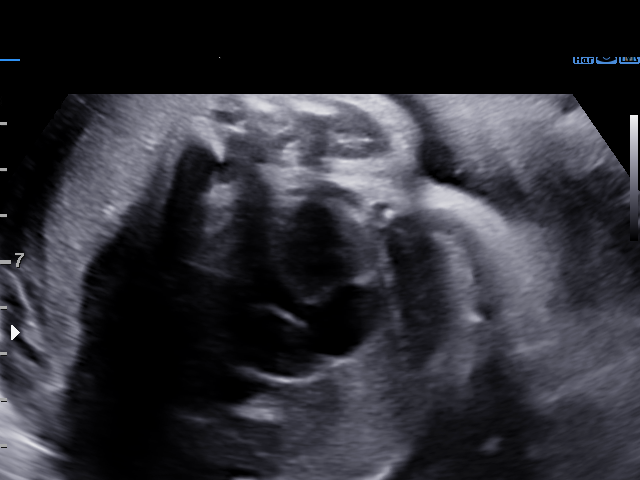
[im 18/98]
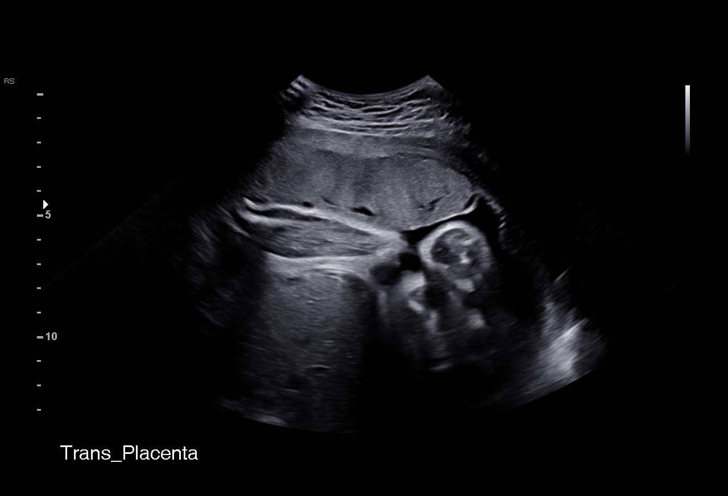
[im 26/98]
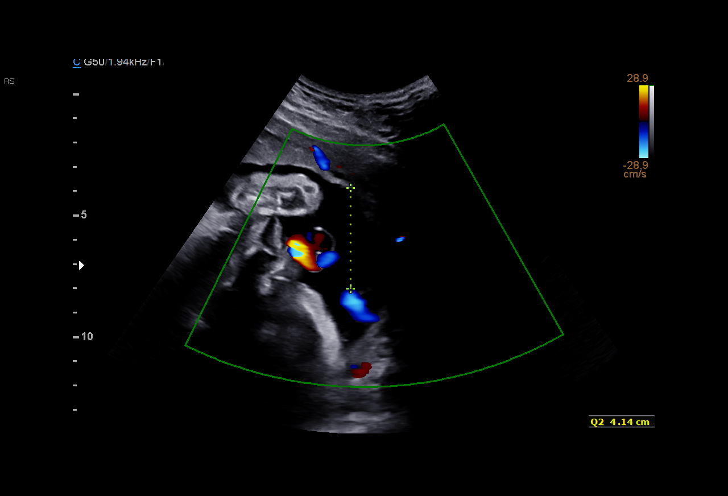
[im 33/98]
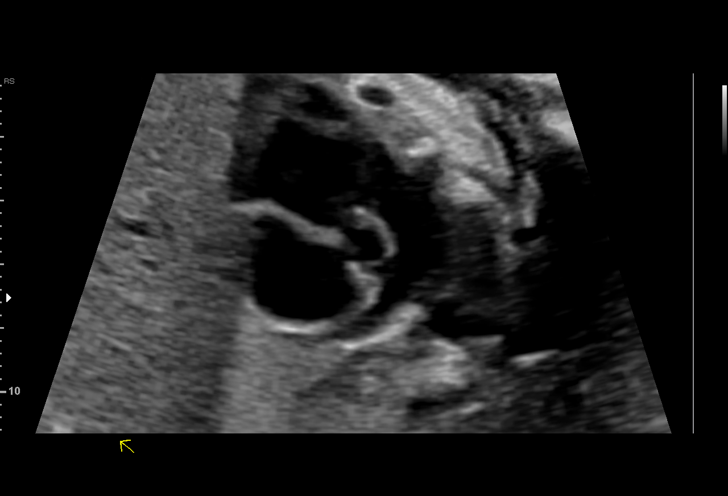
[im 40/98]
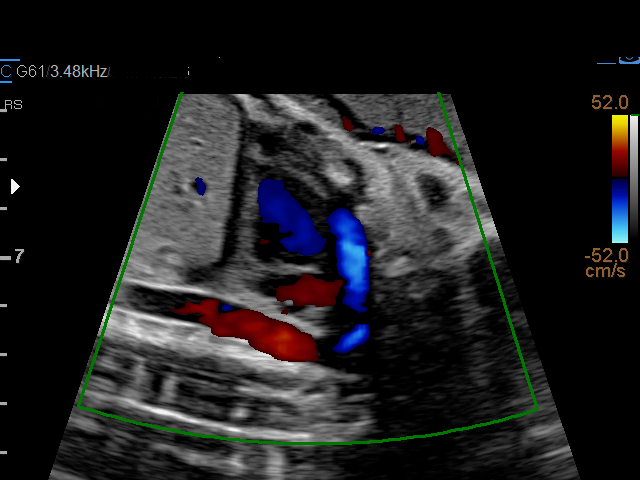
[im 47/98]
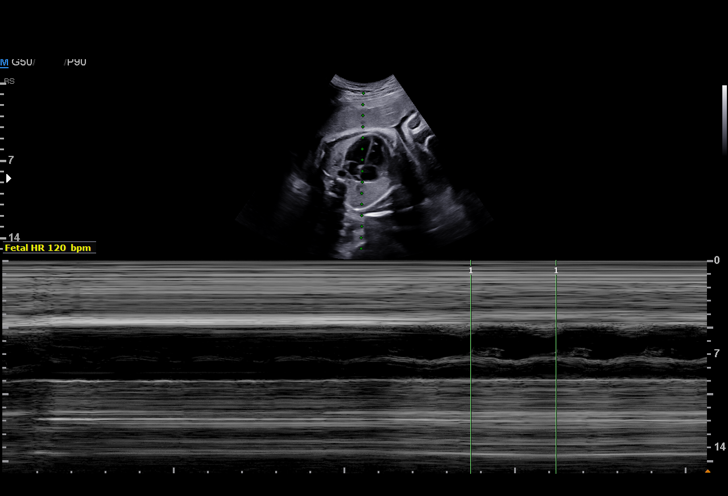
[im 54/98]
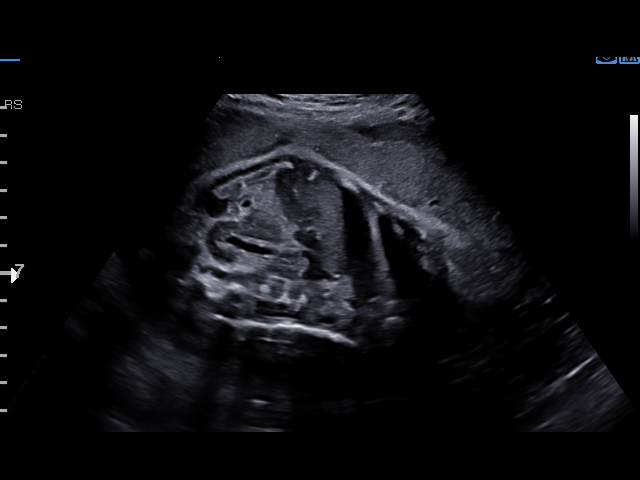
[im 62/98]
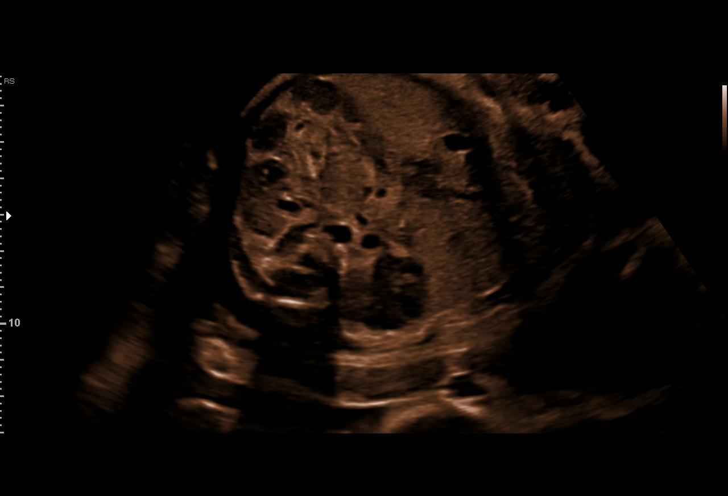
[im 69/98]
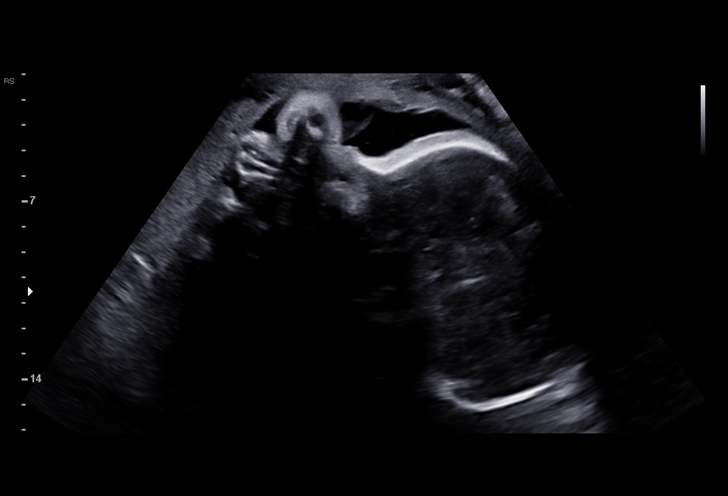
[im 76/98]
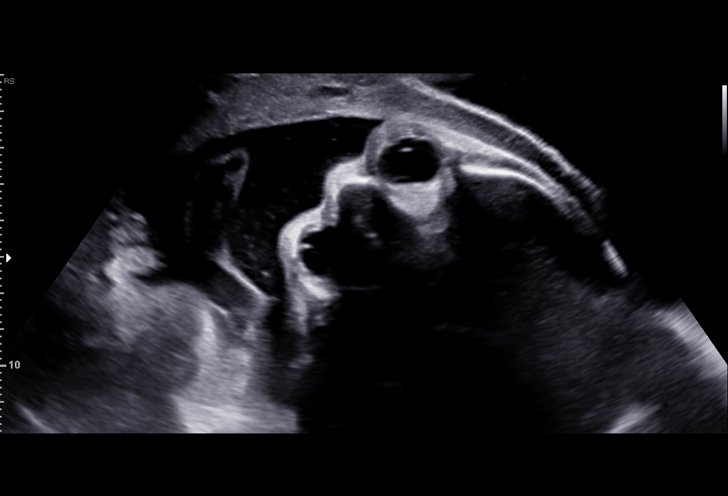
[im 83/98]
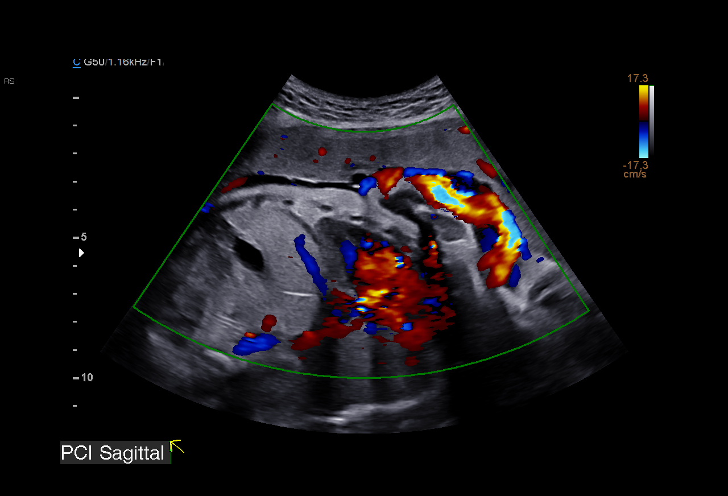
[im 90/98]
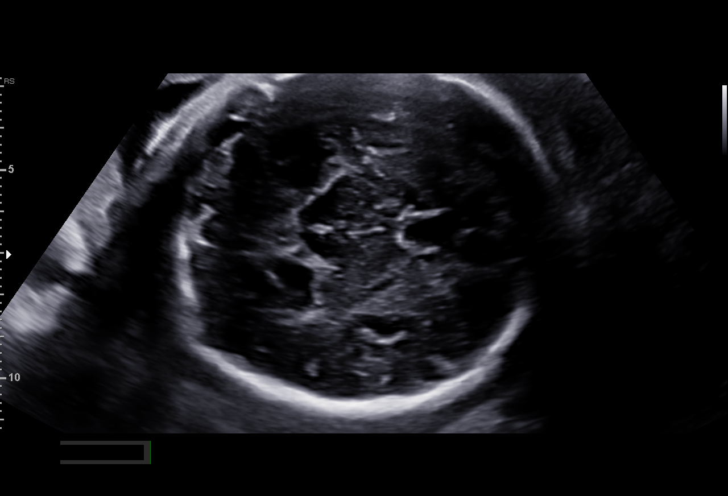
[im 98/98]
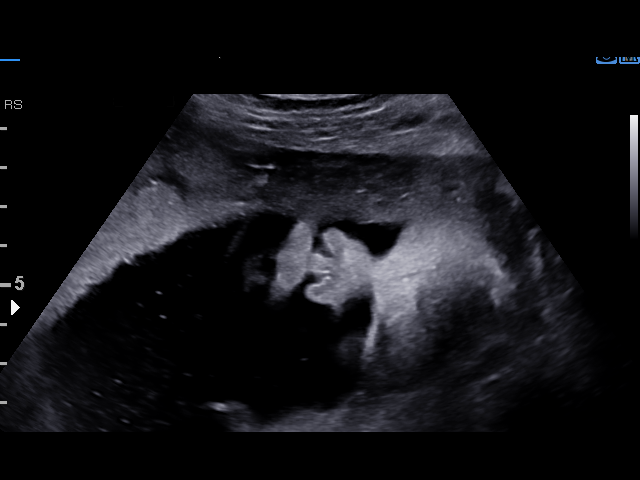

[14 of 28 positions shown; findings below may reference images not displayed]

FINDINGS: 1. Single intrauterine pregnancy.
2. Estimated fetal weight is in the 80th%.
3. Anterior placenta without evidence of previa.
4. Normal amniotic fluid index.
5. The anatomy survey is limited as above; no abnormalities
seen on limited exam.
Recommendations

1. Appropriate fetal growth.
2. Normal limited anatomy survey.
3. PPROM:
- management per primary OB
4. Follow up ultrasounds as clinically indicated

## 2018-08-26 NOTE — L&D Delivery Note (Signed)
Delivery Note At 5:07 PM a viable female was delivered via Vaginal, Spontaneous   Presentation OA Apgars pending Weight pending Placenta routine Cord PH not sent  Complications None  Anesthesia:  CLE Episiotomy: None Lacerations: None Suture Repair: vicryl 4/0 - tiny figure of eight Est. Blood Loss (mL): 100   It's a boy - "Alexis Mullins" - to join his big brother [name]!!   Mom to postpartum.  Baby to Couplet care / Skin to Skin.   [name] 07/15/2019, 5:38 PM

## 2018-11-03 DIAGNOSIS — F418 Other specified anxiety disorders: Secondary | ICD-10-CM | POA: Diagnosis not present

## 2018-11-03 DIAGNOSIS — R5383 Other fatigue: Secondary | ICD-10-CM | POA: Diagnosis not present

## 2018-12-03 DIAGNOSIS — R110 Nausea: Secondary | ICD-10-CM | POA: Diagnosis not present

## 2018-12-03 DIAGNOSIS — F418 Other specified anxiety disorders: Secondary | ICD-10-CM | POA: Diagnosis not present

## 2018-12-07 DIAGNOSIS — Z3201 Encounter for pregnancy test, result positive: Secondary | ICD-10-CM | POA: Diagnosis not present

## 2018-12-31 DIAGNOSIS — N911 Secondary amenorrhea: Secondary | ICD-10-CM | POA: Diagnosis not present

## 2019-01-11 DIAGNOSIS — Z3481 Encounter for supervision of other normal pregnancy, first trimester: Secondary | ICD-10-CM | POA: Diagnosis not present

## 2019-01-11 DIAGNOSIS — Z3685 Encounter for antenatal screening for Streptococcus B: Secondary | ICD-10-CM | POA: Diagnosis not present

## 2019-01-11 LAB — OB RESULTS CONSOLE HEPATITIS B SURFACE ANTIGEN: Hepatitis B Surface Ag: NEGATIVE

## 2019-01-11 LAB — OB RESULTS CONSOLE RPR: RPR: NONREACTIVE

## 2019-01-11 LAB — OB RESULTS CONSOLE ANTIBODY SCREEN: Antibody Screen: POSITIVE

## 2019-01-11 LAB — OB RESULTS CONSOLE RUBELLA ANTIBODY, IGM: Rubella: IMMUNE

## 2019-01-11 LAB — OB RESULTS CONSOLE HIV ANTIBODY (ROUTINE TESTING): HIV: NONREACTIVE

## 2019-01-11 LAB — OB RESULTS CONSOLE ABO/RH: RH Type: POSITIVE

## 2019-01-11 LAB — OB RESULTS CONSOLE GC/CHLAMYDIA
Chlamydia: NEGATIVE
Gonorrhea: NEGATIVE

## 2019-01-22 DIAGNOSIS — Z3480 Encounter for supervision of other normal pregnancy, unspecified trimester: Secondary | ICD-10-CM | POA: Diagnosis not present

## 2019-01-22 DIAGNOSIS — Z331 Pregnant state, incidental: Secondary | ICD-10-CM | POA: Diagnosis not present

## 2019-01-22 DIAGNOSIS — Z124 Encounter for screening for malignant neoplasm of cervix: Secondary | ICD-10-CM | POA: Diagnosis not present

## 2019-01-22 DIAGNOSIS — Z3481 Encounter for supervision of other normal pregnancy, first trimester: Secondary | ICD-10-CM | POA: Diagnosis not present

## 2019-01-22 DIAGNOSIS — Z3400 Encounter for supervision of normal first pregnancy, unspecified trimester: Secondary | ICD-10-CM | POA: Diagnosis not present

## 2019-02-04 DIAGNOSIS — O361920 Maternal care for other isoimmunization, second trimester, not applicable or unspecified: Secondary | ICD-10-CM | POA: Diagnosis not present

## 2019-02-04 DIAGNOSIS — O361921 Maternal care for other isoimmunization, second trimester, fetus 1: Secondary | ICD-10-CM | POA: Diagnosis not present

## 2019-02-04 DIAGNOSIS — Z3A15 15 weeks gestation of pregnancy: Secondary | ICD-10-CM | POA: Diagnosis not present

## 2019-02-05 DIAGNOSIS — O361990 Maternal care for other isoimmunization, unspecified trimester, not applicable or unspecified: Secondary | ICD-10-CM | POA: Insufficient documentation

## 2019-02-11 DIAGNOSIS — Z3481 Encounter for supervision of other normal pregnancy, first trimester: Secondary | ICD-10-CM | POA: Diagnosis not present

## 2019-02-12 DIAGNOSIS — Z3689 Encounter for other specified antenatal screening: Secondary | ICD-10-CM | POA: Diagnosis not present

## 2019-02-12 DIAGNOSIS — O4592 Premature separation of placenta, unspecified, second trimester: Secondary | ICD-10-CM | POA: Diagnosis not present

## 2019-02-12 DIAGNOSIS — O09292 Supervision of pregnancy with other poor reproductive or obstetric history, second trimester: Secondary | ICD-10-CM | POA: Diagnosis not present

## 2019-02-12 DIAGNOSIS — O361920 Maternal care for other isoimmunization, second trimester, not applicable or unspecified: Secondary | ICD-10-CM | POA: Diagnosis not present

## 2019-02-12 DIAGNOSIS — O09212 Supervision of pregnancy with history of pre-term labor, second trimester: Secondary | ICD-10-CM | POA: Diagnosis not present

## 2019-02-12 DIAGNOSIS — Z3A16 16 weeks gestation of pregnancy: Secondary | ICD-10-CM | POA: Diagnosis not present

## 2019-03-03 DIAGNOSIS — Z3A18 18 weeks gestation of pregnancy: Secondary | ICD-10-CM | POA: Diagnosis not present

## 2019-03-03 DIAGNOSIS — Z363 Encounter for antenatal screening for malformations: Secondary | ICD-10-CM | POA: Diagnosis not present

## 2019-03-03 DIAGNOSIS — Z36 Encounter for antenatal screening for chromosomal anomalies: Secondary | ICD-10-CM | POA: Diagnosis not present

## 2019-03-03 DIAGNOSIS — O361920 Maternal care for other isoimmunization, second trimester, not applicable or unspecified: Secondary | ICD-10-CM | POA: Diagnosis not present

## 2019-05-05 DIAGNOSIS — Z3480 Encounter for supervision of other normal pregnancy, unspecified trimester: Secondary | ICD-10-CM | POA: Diagnosis not present

## 2019-07-01 LAB — OB RESULTS CONSOLE GBS: GBS: NEGATIVE

## 2019-07-09 ENCOUNTER — Telehealth (HOSPITAL_COMMUNITY): Payer: Self-pay | Admitting: *Deleted

## 2019-07-09 ENCOUNTER — Encounter (HOSPITAL_COMMUNITY): Payer: Self-pay | Admitting: *Deleted

## 2019-07-09 NOTE — Telephone Encounter (Signed)
Preadmission screen  

## 2019-07-12 ENCOUNTER — Other Ambulatory Visit: Payer: Self-pay

## 2019-07-12 ENCOUNTER — Encounter (HOSPITAL_COMMUNITY): Payer: Self-pay

## 2019-07-12 ENCOUNTER — Inpatient Hospital Stay (EMERGENCY_DEPARTMENT_HOSPITAL)
Admission: AD | Admit: 2019-07-12 | Discharge: 2019-07-12 | Disposition: A | Discharge status: Home / Self Care | Payer: 59 | Source: Home / Self Care | Attending: Obstetrics and Gynecology | Admitting: Obstetrics and Gynecology

## 2019-07-12 DIAGNOSIS — O26893 Other specified pregnancy related conditions, third trimester: Secondary | ICD-10-CM | POA: Insufficient documentation

## 2019-07-12 DIAGNOSIS — Z3A37 37 weeks gestation of pregnancy: Secondary | ICD-10-CM | POA: Insufficient documentation

## 2019-07-12 DIAGNOSIS — Z0371 Encounter for suspected problem with amniotic cavity and membrane ruled out: Secondary | ICD-10-CM | POA: Diagnosis not present

## 2019-07-12 LAB — POCT FERN TEST: POCT Fern Test: NEGATIVE

## 2019-07-12 LAB — AMNISURE RUPTURE OF MEMBRANE (ROM) NOT AT ARMC: Amnisure ROM: NEGATIVE

## 2019-07-12 NOTE — Discharge Instructions (Signed)
The tests performed today show that your membranes are NOT ruptured.   Signs and Symptoms of Labor Labor is your body's natural process of moving your baby, placenta, and umbilical cord out of your uterus. The process of labor usually starts when your baby is full-term, between 68 and 40 weeks of pregnancy. How will I know when I am close to going into labor? As your body prepares for labor and the birth of your baby, you may notice the following symptoms in the weeks and days before true labor starts:  Having a strong desire to get your home ready to receive your new baby. This is called nesting. Nesting may be a sign that labor is approaching, and it may occur several weeks before birth. Nesting may involve cleaning and organizing your home.  Passing a small amount of thick, bloody mucus out of your vagina (normal bloody show or losing your mucus plug). This may happen more than a week before labor begins, or it might occur right before labor begins as the opening of the cervix starts to widen (dilate). For some women, the entire mucus plug passes at once. For others, smaller portions of the mucus plug may gradually pass over several days.  Your baby moving (dropping) lower in your pelvis to get into position for birth (lightening). When this happens, you may feel more pressure on your bladder and pelvic bone and less pressure on your ribs. This may make it easier to breathe. It may also cause you to need to urinate more often and have problems with bowel movements.  Having "practice contractions" (Braxton Hicks contractions) that occur at irregular (unevenly spaced) intervals that are more than 10 minutes apart. This is also called false labor. False labor contractions are common after exercise or sexual activity, and they will stop if you change position, rest, or drink fluids. These contractions are usually mild and do not get stronger over time. They may feel like: ? A backache or back  pain. ? Mild cramps, similar to menstrual cramps. ? Tightening or pressure in your abdomen. Other early symptoms that labor may be starting soon include:  Nausea or loss of appetite.  Diarrhea.  Having a sudden burst of energy, or feeling very tired.  Mood changes.  Having trouble sleeping. How will I know when labor has begun? Signs that true labor has begun may include:  Having contractions that come at regular (evenly spaced) intervals and increase in intensity. This may feel like more intense tightening or pressure in your abdomen that moves to your back. ? Contractions may also feel like rhythmic pain in your upper thighs or back that comes and goes at regular intervals. ? For first-time mothers, this change in intensity of contractions often occurs at a more gradual pace. ? Women who have given birth before may notice a more rapid progression of contraction changes.  Having a feeling of pressure in the vaginal area.  Your water breaking (rupture of membranes). This is when the sac of fluid that surrounds your baby breaks. When this happens, you will notice fluid leaking from your vagina. This may be clear or blood-tinged. Labor usually starts within 24 hours of your water breaking, but it may take longer to begin. ? Some women notice this as a gush of fluid. ? Others notice that their underwear repeatedly becomes damp. Follow these instructions at home:   When labor starts, or if your water breaks, call your health care provider or nurse care line. Based  on your situation, they will determine when you should go in for an exam.  When you are in early labor, you may be able to rest and manage symptoms at home. Some strategies to try at home include: ? Breathing and relaxation techniques. ? Taking a warm bath or shower. ? Listening to music. ? Using a heating pad on the lower back for pain. If you are directed to use heat:  Place a towel between your skin and the heat  source.  Leave the heat on for 20-30 minutes.  Remove the heat if your skin turns bright red. This is especially important if you are unable to feel pain, heat, or cold. You may have a greater risk of getting burned. Get help right away if:  You have painful, regular contractions that are 5 minutes apart or less.  Labor starts before you are [redacted] weeks along in your pregnancy.  You have a fever.  You have a headache that does not go away.  You have bright red blood coming from your vagina.  You do not feel your baby moving.  You have a sudden onset of: ? Severe headache with vision problems. ? Nausea, vomiting, or diarrhea. ? Chest pain or shortness of breath. These symptoms may be an emergency. If your health care provider recommends that you go to the hospital or birth center where you plan to deliver, do not drive yourself. Have someone else drive you, or call emergency services (911 in the [place] Summary  Labor is your body's natural process of moving your baby, placenta, and umbilical cord out of your uterus.  The process of labor usually starts when your baby is full-term, between 56 and 40 weeks of pregnancy.  When labor starts, or if your water breaks, call your health care provider or nurse care line. Based on your situation, they will determine when you should go in for an exam. This information is not intended to replace advice given to you by your health care provider. Make sure you discuss any questions you have with your health care provider. Document Released: 01/17/2017 Document Revised: 05/12/2017 Document Reviewed: 01/17/2017 Elsevier Patient Education  2020 [employer].

## 2019-07-12 NOTE — MAU Provider Note (Signed)
S: Ms. Alexis Mullins is a 23 y.o. G2P0101 at [redacted]w[redacted]d  who presents to MAU today complaining of leaking of fluid since 0730. She denies vaginal bleeding. She endorses contractions. She reports normal fetal movement.    O: BP 103/65 (BP Location: Right Arm)   Pulse (!) 106   Temp 98 F (36.7 C) (Oral)   Resp 17   Ht 5\' 2"  (1.575 m)   Wt 56.8 kg   SpO2 99%   BMI 22.92 kg/m  GENERAL: Well-developed, well-nourished female in no acute distress.  HEAD: Normocephalic, atraumatic.  CHEST: Normal effort of breathing, regular heart rate ABDOMEN: Soft, nontender, gravid PELVIC: Normal external female genitalia. Vagina is pink and rugated. Cervix with normal contour, no lesions. Normal mucoid discharge.  Negative pooling.   Cervical exam:  Dilation: 3 Effacement (%): 70 Station: -3 Presentation: Vertex Exam by:: Sunday Corn, CNM  Fetal Monitoring: Baseline: 140 Variability: moderate Accelerations: present Decelerations: absent Contractions: regular every 1-1.5 mins  Results for orders placed or performed during the hospital encounter of 07/12/19 (from the past 24 hour(s))  Fern Test     Status: Normal   Collection Time: 07/12/19 11:06 AM  Result Value Ref Range   POCT Fern Test Negative = intact amniotic membranes   Amnisure     Status: None   Collection Time: 07/12/19 11:30 AM  Result Value Ref Range   Amnisure ROM NEGATIVE     A: SIUP at [redacted]w[redacted]d  Membranes intact No leakage of amniotic fluid into vagina    P: Report given to RN to contact MD on call for further instructions  Dr. Gaetano Net gave orders to Rolla Flatten, RN to have MAU provider d/c patient to home. Information provided on signs of labor. Advised that membranes are not ruptured from today's lab test. Patient verbalized an understanding of the plan of care and agrees.    Laury Deep, CNM 07/12/2019, 11:29 AM

## 2019-07-12 NOTE — MAU Note (Signed)
Noted leaking at 0730, clear fluid, ? Still leaking. Having contractions, couple an hour. Was 3+ on Friday. Scheduled for induction on Thursday.

## 2019-07-13 ENCOUNTER — Other Ambulatory Visit (HOSPITAL_COMMUNITY): Admission: RE | Admit: 2019-07-13 | Payer: 59 | Source: Ambulatory Visit

## 2019-07-14 ENCOUNTER — Other Ambulatory Visit (HOSPITAL_COMMUNITY)
Admission: RE | Admit: 2019-07-14 | Discharge: 2019-07-14 | Disposition: A | Discharge status: Home / Self Care | Payer: 59 | Source: Ambulatory Visit | Attending: Obstetrics and Gynecology | Admitting: Obstetrics and Gynecology

## 2019-07-14 LAB — SARS CORONAVIRUS 2 (TAT 6-24 HRS): SARS Coronavirus 2: NEGATIVE

## 2019-07-14 NOTE — H&P (Signed)
Alexis Mullins is a 23 y.o. female presenting for IOL s/s isoimmunization. She has a hx of PPROM with an abruption with last pregnancy followed by SVD at 50 wga of Alexis Mullins, 4#12.5. This pregnancy c/b Anti C titers positivity and she has been followed with serial [place] and UADs which have been reassuring. She also has anemia for which she takes iron. She failed her 1 hr but declined her 3 hour - instead she took BS at home which were all reassuring. She was never diagnosed with GDM this pregnancy. She has a hx harrington rods for her scoliosis.  This pregnancy they are expecting a boy to join brother Alexis Mullins who they plan to name "Alexis Mullins". OB History    Gravida  2   Para  1   Term      Preterm  1   AB      Living  1     SAB      TAB      Ectopic      Multiple  0   Live Births  1          Past Medical History:  Diagnosis Date  . Anemia   . Depression   . Gestational diabetes   . Medical history non-contributory    Past Surgical History:  Procedure Laterality Date  . BACK SURGERY    . TONSILLECTOMY     Family History: family history is not on file. Social History:  reports that she has never smoked. She has never used smokeless tobacco. She reports that she does not drink alcohol or use drugs.     Maternal Diabetes: No Genetic Screening: Declined Maternal Ultrasounds/Referrals: Normal Fetal Ultrasounds or other Referrals:  Referred to Materal Fetal Medicine  for isommunization - serial UAD done Maternal Substance Abuse:  No Significant Maternal Medications:  None Significant Maternal Lab Results:  None Other Comments:  None  ROS History   unknown if currently breastfeeding. Exam Physical Exam  (from office) NAD, A&O NWOB Abd soft, nondistended, gravid SVE 3/50/-2 Prenatal labs: ABO, Rh: A/Positive/-- (05/18 0000) Antibody: Positive (05/18 0000) Rubella: Immune (05/18 0000) RPR: Nonreactive (05/18 0000)  HBsAg: Negative (05/18 0000)  HIV: Non-reactive  (05/18 0000)  GBS: Negative/-- (11/05 0000)   Assessment/Plan: 23 yo G2P0101 @ 38.0 wga presenting for IOL s/s isoimmunization. Cervix favorable. Plan for Pitocin/AROM. GBS negative.     Alexis Mullins 07/14/2019, 1:44 PM

## 2019-07-15 ENCOUNTER — Inpatient Hospital Stay (HOSPITAL_COMMUNITY): Payer: 59

## 2019-07-15 ENCOUNTER — Encounter (HOSPITAL_COMMUNITY): Payer: Self-pay | Admitting: *Deleted

## 2019-07-15 ENCOUNTER — Encounter (HOSPITAL_COMMUNITY): Payer: Self-pay | Admitting: Anesthesiology

## 2019-07-15 ENCOUNTER — Inpatient Hospital Stay (HOSPITAL_COMMUNITY)
Admission: AD | Admit: 2019-07-15 | Discharge: 2019-07-16 | DRG: 807 | Disposition: A | Discharge status: Home / Self Care | Payer: 59 | Attending: Obstetrics and Gynecology | Admitting: Obstetrics and Gynecology

## 2019-07-15 ENCOUNTER — Inpatient Hospital Stay (HOSPITAL_COMMUNITY): Payer: 59 | Admitting: Anesthesiology

## 2019-07-15 DIAGNOSIS — O361930 Maternal care for other isoimmunization, third trimester, not applicable or unspecified: Principal | ICD-10-CM | POA: Diagnosis present

## 2019-07-15 DIAGNOSIS — Z20828 Contact with and (suspected) exposure to other viral communicable diseases: Secondary | ICD-10-CM | POA: Diagnosis present

## 2019-07-15 DIAGNOSIS — Z3A38 38 weeks gestation of pregnancy: Secondary | ICD-10-CM | POA: Diagnosis not present

## 2019-07-15 DIAGNOSIS — Z3490 Encounter for supervision of normal pregnancy, unspecified, unspecified trimester: Secondary | ICD-10-CM

## 2019-07-15 DIAGNOSIS — Z0371 Encounter for suspected problem with amniotic cavity and membrane ruled out: Secondary | ICD-10-CM

## 2019-07-15 LAB — CBC
HCT: 36.1 % (ref 36.0–46.0)
Hemoglobin: 11.1 g/dL — ABNORMAL LOW (ref 12.0–15.0)
MCH: 24.7 pg — ABNORMAL LOW (ref 26.0–34.0)
MCHC: 30.7 g/dL (ref 30.0–36.0)
MCV: 80.4 fL (ref 80.0–100.0)
Platelets: 190 10*3/uL (ref 150–400)
RBC: 4.49 MIL/uL (ref 3.87–5.11)
RDW: 19.2 % — ABNORMAL HIGH (ref 11.5–15.5)
WBC: 12.6 10*3/uL — ABNORMAL HIGH (ref 4.0–10.5)
nRBC: 0 % (ref 0.0–0.2)

## 2019-07-15 MED ORDER — HYDROXYZINE HCL 50 MG PO TABS: 50.0000 mg | ORAL_TABLET | Freq: Four times a day (QID) | ORAL | Status: DC | PRN

## 2019-07-15 MED ORDER — LIDOCAINE HCL (PF) 1 % IJ SOLN
INTRAMUSCULAR | Status: DC | PRN
  Administered 2019-07-15: 6 mL via EPIDURAL

## 2019-07-15 MED ORDER — COCONUT OIL OIL: 1.0000 "application " | TOPICAL_OIL | Status: DC | PRN

## 2019-07-15 MED ORDER — TETANUS-DIPHTH-ACELL PERTUSSIS 5-2.5-18.5 LF-MCG/0.5 IM SUSP: 0.5000 mL | Freq: Once | INTRAMUSCULAR | Status: DC

## 2019-07-15 MED ORDER — LACTATED RINGERS IV SOLN
500.0000 mL | Freq: Once | INTRAVENOUS | Status: AC
  Administered 2019-07-15: 500 mL via INTRAVENOUS

## 2019-07-15 MED ORDER — DIBUCAINE (PERIANAL) 1 % EX OINT: 1.0000 "application " | TOPICAL_OINTMENT | CUTANEOUS | Status: DC | PRN

## 2019-07-15 MED ORDER — ONDANSETRON HCL 4 MG/2ML IJ SOLN: 4.0000 mg | INTRAMUSCULAR | Status: DC | PRN

## 2019-07-15 MED ORDER — ACETAMINOPHEN 325 MG PO TABS: 650.0000 mg | ORAL_TABLET | ORAL | Status: DC | PRN

## 2019-07-15 MED ORDER — OXYCODONE HCL 5 MG PO TABS: 5.0000 mg | ORAL_TABLET | ORAL | Status: DC | PRN

## 2019-07-15 MED ORDER — PHENYLEPHRINE 40 MCG/ML (10ML) SYRINGE FOR IV PUSH (FOR BLOOD PRESSURE SUPPORT): 80.0000 ug | PREFILLED_SYRINGE | INTRAVENOUS | Status: DC | PRN

## 2019-07-15 MED ORDER — EPHEDRINE 5 MG/ML INJ
10.0000 mg | INTRAVENOUS | Status: DC | PRN
  Filled 2019-07-15: qty 10

## 2019-07-15 MED ORDER — ONDANSETRON HCL 4 MG PO TABS: 4.0000 mg | ORAL_TABLET | ORAL | Status: DC | PRN

## 2019-07-15 MED ORDER — ONDANSETRON HCL 4 MG/2ML IJ SOLN
4.0000 mg | Freq: Four times a day (QID) | INTRAMUSCULAR | Status: DC | PRN
  Administered 2019-07-15: 4 mg via INTRAVENOUS
  Filled 2019-07-15: qty 2

## 2019-07-15 MED ORDER — IBUPROFEN 600 MG PO TABS
600.0000 mg | ORAL_TABLET | Freq: Four times a day (QID) | ORAL | Status: DC
  Administered 2019-07-15 – 2019-07-16 (×3): 600 mg via ORAL
  Filled 2019-07-15 (×3): qty 1

## 2019-07-15 MED ORDER — OXYTOCIN 40 UNITS IN NORMAL SALINE INFUSION - SIMPLE MED
1.0000 m[IU]/min | INTRAVENOUS | Status: DC
  Administered 2019-07-15: 2 m[IU]/min via INTRAVENOUS
  Filled 2019-07-15: qty 1000

## 2019-07-15 MED ORDER — OXYCODONE-ACETAMINOPHEN 5-325 MG PO TABS: 1.0000 | ORAL_TABLET | ORAL | Status: DC | PRN

## 2019-07-15 MED ORDER — SODIUM CHLORIDE (PF) 0.9 % IJ SOLN
INTRAMUSCULAR | Status: DC | PRN
  Administered 2019-07-15: 12 mL/h via EPIDURAL

## 2019-07-15 MED ORDER — BENZOCAINE-MENTHOL 20-0.5 % EX AERO: 1.0000 "application " | INHALATION_SPRAY | CUTANEOUS | Status: DC | PRN

## 2019-07-15 MED ORDER — LACTATED RINGERS IV SOLN: 500.0000 mL | INTRAVENOUS | Status: DC | PRN

## 2019-07-15 MED ORDER — DIPHENHYDRAMINE HCL 50 MG/ML IJ SOLN: 12.5000 mg | INTRAMUSCULAR | Status: DC | PRN

## 2019-07-15 MED ORDER — OXYCODONE HCL 5 MG PO TABS: 10.0000 mg | ORAL_TABLET | ORAL | Status: DC | PRN

## 2019-07-15 MED ORDER — PRENATAL MULTIVITAMIN CH
1.0000 | ORAL_TABLET | Freq: Every day | ORAL | Status: DC
  Administered 2019-07-16: 1 via ORAL
  Filled 2019-07-15: qty 1

## 2019-07-15 MED ORDER — EPHEDRINE 5 MG/ML INJ: 10.0000 mg | INTRAVENOUS | Status: DC | PRN

## 2019-07-15 MED ORDER — SIMETHICONE 80 MG PO CHEW: 80.0000 mg | CHEWABLE_TABLET | ORAL | Status: DC | PRN

## 2019-07-15 MED ORDER — FENTANYL-BUPIVACAINE-NACL 0.5-0.125-0.9 MG/250ML-% EP SOLN
12.0000 mL/h | EPIDURAL | Status: DC | PRN
  Filled 2019-07-15: qty 250

## 2019-07-15 MED ORDER — OXYCODONE-ACETAMINOPHEN 5-325 MG PO TABS: 2.0000 | ORAL_TABLET | ORAL | Status: DC | PRN

## 2019-07-15 MED ORDER — OXYTOCIN BOLUS FROM INFUSION
500.0000 mL | Freq: Once | INTRAVENOUS | Status: AC
  Administered 2019-07-15: 500 mL via INTRAVENOUS

## 2019-07-15 MED ORDER — OXYTOCIN 40 UNITS IN NORMAL SALINE INFUSION - SIMPLE MED
2.5000 [IU]/h | INTRAVENOUS | Status: DC
  Administered 2019-07-15: 2.5 [IU]/h via INTRAVENOUS

## 2019-07-15 MED ORDER — BUTORPHANOL TARTRATE 1 MG/ML IJ SOLN: 1.0000 mg | INTRAMUSCULAR | Status: DC | PRN

## 2019-07-15 MED ORDER — ACETAMINOPHEN 325 MG PO TABS
650.0000 mg | ORAL_TABLET | ORAL | Status: DC | PRN
  Administered 2019-07-16: 650 mg via ORAL
  Filled 2019-07-15: qty 2

## 2019-07-15 MED ORDER — LIDOCAINE HCL (PF) 1 % IJ SOLN: 30.0000 mL | INTRAMUSCULAR | Status: DC | PRN

## 2019-07-15 MED ORDER — ESCITALOPRAM OXALATE 20 MG PO TABS
20.0000 mg | ORAL_TABLET | Freq: Every day | ORAL | Status: DC
  Administered 2019-07-15: 20 mg via ORAL
  Filled 2019-07-15: qty 1

## 2019-07-15 MED ORDER — WITCH HAZEL-GLYCERIN EX PADS: 1.0000 "application " | MEDICATED_PAD | CUTANEOUS | Status: DC | PRN

## 2019-07-15 MED ORDER — ZOLPIDEM TARTRATE 5 MG PO TABS: 5.0000 mg | ORAL_TABLET | Freq: Every evening | ORAL | Status: DC | PRN

## 2019-07-15 MED ORDER — PHENYLEPHRINE 40 MCG/ML (10ML) SYRINGE FOR IV PUSH (FOR BLOOD PRESSURE SUPPORT)
80.0000 ug | PREFILLED_SYRINGE | INTRAVENOUS | Status: DC | PRN
  Filled 2019-07-15: qty 10

## 2019-07-15 MED ORDER — SOD CITRATE-CITRIC ACID 500-334 MG/5ML PO SOLN: 30.0000 mL | ORAL | Status: DC | PRN

## 2019-07-15 MED ORDER — TERBUTALINE SULFATE 1 MG/ML IJ SOLN: 0.2500 mg | Freq: Once | INTRAMUSCULAR | Status: DC | PRN

## 2019-07-15 MED ORDER — DIPHENHYDRAMINE HCL 25 MG PO CAPS: 25.0000 mg | ORAL_CAPSULE | Freq: Four times a day (QID) | ORAL | Status: DC | PRN

## 2019-07-15 MED ORDER — SENNOSIDES-DOCUSATE SODIUM 8.6-50 MG PO TABS
2.0000 | ORAL_TABLET | ORAL | Status: DC
  Administered 2019-07-15: 2 via ORAL
  Filled 2019-07-15: qty 2

## 2019-07-15 MED ORDER — LACTATED RINGERS IV SOLN
INTRAVENOUS | Status: DC
  Administered 2019-07-15 (×2): via INTRAVENOUS

## 2019-07-15 NOTE — Anesthesia Procedure Notes (Signed)
Epidural Patient location during procedure: OB Start time: 07/15/2019 3:28 PM End time: 07/15/2019 3:32 PM  Staffing Anesthesiologist: Janeece Riggers, MD  Preanesthetic Checklist Completed: patient identified, site marked, surgical consent, pre-op evaluation, timeout performed, IV checked, risks and benefits discussed and monitors and equipment checked  Epidural Patient position: sitting Prep: site prepped and draped and DuraPrep Patient monitoring: continuous pulse ox and blood pressure Approach: midline Location: L3-L4 Injection technique: LOR air  Needle:  Needle type: Tuohy  Needle gauge: 17 G Needle length: 9 cm and 9 Needle insertion depth: 5 cm cm Catheter type: closed end flexible Catheter size: 19 Gauge Catheter at skin depth: 10 cm Test dose: negative  Assessment Events: blood not aspirated, injection not painful, no injection resistance, negative IV test and no paresthesia

## 2019-07-15 NOTE — Progress Notes (Signed)
NO updates to H&P SVE 5/long/-2, AROM clear fluid Start pitocin CLE when desires

## 2019-07-15 NOTE — Anesthesia Preprocedure Evaluation (Addendum)
Anesthesia Evaluation  Patient identified by MRN, date of birth, ID band Patient awake    Reviewed: Allergy & Precautions, H&P , NPO status , Patient's Chart, lab work & pertinent test results, reviewed documented beta blocker date and time   Airway Mallampati: I  TM Distance: >3 FB Neck ROM: full    Dental no notable dental hx. (+) Teeth Intact, Dental Advisory Given   Pulmonary neg pulmonary ROS,    Pulmonary exam normal breath sounds clear to auscultation       Cardiovascular negative cardio ROS Normal cardiovascular exam Rhythm:regular Rate:Normal     Neuro/Psych negative neurological ROS  negative psych ROS   GI/Hepatic negative GI ROS, Neg liver ROS,   Endo/Other  negative endocrine ROSdiabetes  Renal/GU negative Renal ROS  negative genitourinary   Musculoskeletal   Abdominal   Peds  Hematology negative hematology ROS (+)   Anesthesia Other Findings   Reproductive/Obstetrics (+) Pregnancy                            Anesthesia Physical Anesthesia Plan  ASA: III  Anesthesia Plan: Epidural   Post-op Pain Management:    Induction:   PONV Risk Score and Plan:   Airway Management Planned:   Additional Equipment:   Intra-op Plan:   Post-operative Plan:   Informed Consent: I have reviewed the patients History and Physical, chart, labs and discussed the procedure including the risks, benefits and alternatives for the proposed anesthesia with the patient or authorized representative who has indicated his/her understanding and acceptance.     Dental Advisory Given  Plan Discussed with:   Anesthesia Plan Comments: (Labs checked- platelets confirmed with RN in room. Fetal heart tracing, per RN, reported to be stable enough for sitting procedure. Discussed epidural, and patient consents to the procedure:  included risk of possible headache,backache, failed block, allergic  reaction, and nerve injury. This patient was asked if she had any questions or concerns before the procedure started.  HARRINGTON RODS)        Anesthesia Quick Evaluation

## 2019-07-16 ENCOUNTER — Encounter (HOSPITAL_COMMUNITY): Payer: Self-pay

## 2019-07-16 LAB — CBC
HCT: 32.8 % — ABNORMAL LOW (ref 36.0–46.0)
Hemoglobin: 10.2 g/dL — ABNORMAL LOW (ref 12.0–15.0)
MCH: 24.5 pg — ABNORMAL LOW (ref 26.0–34.0)
MCHC: 31.1 g/dL (ref 30.0–36.0)
MCV: 78.8 fL — ABNORMAL LOW (ref 80.0–100.0)
Platelets: 164 10*3/uL (ref 150–400)
RBC: 4.16 MIL/uL (ref 3.87–5.11)
RDW: 19.3 % — ABNORMAL HIGH (ref 11.5–15.5)
WBC: 16.6 10*3/uL — ABNORMAL HIGH (ref 4.0–10.5)
nRBC: 0 % (ref 0.0–0.2)

## 2019-07-16 LAB — RPR: RPR Ser Ql: NONREACTIVE

## 2019-07-16 MED ORDER — IBUPROFEN 600 MG PO TABS: 600.0000 mg | ORAL_TABLET | Freq: Four times a day (QID) | ORAL | 0 refills | Status: DC

## 2019-07-16 NOTE — Anesthesia Postprocedure Evaluation (Signed)
Anesthesia Post Note  Patient: Terrilee Dudzik  Procedure(s) Performed: AN AD [place]     Patient location during evaluation: Mother Baby Anesthesia Type: Epidural Level of consciousness: awake and alert and oriented Pain management: satisfactory to patient Vital Signs Assessment: post-procedure vital signs reviewed and stable Respiratory status: respiratory function stable Cardiovascular status: stable Postop Assessment: no headache, no backache, epidural receding, patient able to bend at knees, no signs of nausea or vomiting and adequate PO intake Anesthetic complications: no    Last Vitals:  Vitals:   07/16/19 0125 07/16/19 0540  BP: 104/71 102/66  Pulse: 60 (!) 53  Resp: 18 16  Temp: 36.9 C 36.8 C  SpO2: 99%     Last Pain:  Vitals:   07/16/19 0540  TempSrc: Oral  PainSc: 1    Pain Goal: Patients Stated Pain Goal: 8 (07/15/19 1402)                 Lashina Milles

## 2019-07-16 NOTE — Discharge Summary (Signed)
Obstetric Discharge Summary Reason for Admission: induction of labor for isoimmunization Prenatal Procedures: none Intrapartum Procedures: spontaneous vaginal delivery Postpartum Procedures: none Complications-Operative and Postpartum: none Hemoglobin  Date Value Ref Range Status  07/16/2019 10.2 (L) 12.0 - 15.0 g/dL Final   HCT  Date Value Ref Range Status  07/16/2019 32.8 (L) 36.0 - 46.0 % Final    Physical Exam:  General: alert, cooperative and appears stated age 23: appropriate Uterine Fundus: firm Incision: healing well, no significant drainage, no dehiscence DVT Evaluation: No evidence of DVT seen on physical exam. Negative Homan's sign. No cords or calf tenderness.  Discharge Diagnoses: Term Pregnancy-delivered and isoimmunization  Discharge Information: Date: 07/16/2019 Activity: pelvic rest Diet: routine Medications: PNV and Ibuprofen Condition: stable Instructions: refer to practice specific booklet Discharge to: home   Newborn Data: Live born female  Birth Weight: 5 lb 12.2 oz (2615 g) APGAR: 8, 9  Newborn Delivery   Birth date/time: 07/15/2019 17:07:00 Delivery type: Vaginal, Spontaneous      Home with mother.  Linda Hedges 07/16/2019, 8:39 AM

## 2019-07-16 NOTE — Progress Notes (Signed)
MOB was referred for history of depression/anxiety. * Referral screened out by Clinical Social Worker because none of the following criteria appear to apply: ~ History of anxiety/depression during this pregnancy, or of post-partum depression following prior delivery. ~ Diagnosis of anxiety and/or depression within last 3 years OR * MOB's symptoms currently being treated with medication and/or therapy. Per chart review, MOB on Lexapro for anxiety and depression.    Please contact the Clinical Social Worker if needs arise, by [facility] request, or if MOB scores greater than 9/yes to question 10 on Edinburgh Postpartum Depression Screen.       Alexis Mullins Dagny Fiorentino, MSW, LCSW [facility] [place] [phone]

## 2019-07-16 NOTE — Discharge Instructions (Signed)
Call MD for T>100.4, heavy vaginal bleeding, severe abdominal pain, or respiratory distress.  Call office to schedule postpartum visit in 6 weeks.  Pelvic rest x 6 weeks.   

## 2019-07-16 NOTE — Progress Notes (Signed)
Post Partum Day 1 Subjective: no complaints, up ad lib, voiding and tolerating PO.  Declines circ.  Would like d/c home today.  Objective: Blood pressure 102/66, pulse (!) 53, temperature 98.2 F (36.8 C), temperature source Oral, resp. rate 16, SpO2 99 %, unknown if currently breastfeeding.  Physical Exam:  General: alert, cooperative and appears stated age Lochia: appropriate Uterine Fundus: firm Incision: healing well, no significant drainage, no dehiscence DVT Evaluation: No evidence of DVT seen on physical exam. Negative Homan's sign. No cords or calf tenderness.  Recent Labs    07/15/19 1319 07/16/19 0522  HGB 11.1* 10.2*  HCT 36.1 32.8*    Assessment/Plan: Discharge home and Breastfeeding   LOS: 1 day   Linda Hedges 07/16/2019, 8:36 AM

## 2019-07-17 LAB — TYPE AND SCREEN
ABO/RH(D): A POS
Antibody Screen: POSITIVE
Donor AG Type: NEGATIVE
Donor AG Type: NEGATIVE
Unit division: 0
Unit division: 0

## 2019-07-17 LAB — BPAM RBC
Blood Product Expiration Date: 202012092359
Blood Product Expiration Date: 202012152359
Unit Type and Rh: 5100
Unit Type and Rh: 6200

## 2019-09-15 DIAGNOSIS — D649 Anemia, unspecified: Secondary | ICD-10-CM | POA: Insufficient documentation

## 2019-09-15 DIAGNOSIS — F32A Depression, unspecified: Secondary | ICD-10-CM | POA: Insufficient documentation

## 2019-09-15 DIAGNOSIS — F329 Major depressive disorder, single episode, unspecified: Secondary | ICD-10-CM | POA: Insufficient documentation

## 2019-09-15 HISTORY — DX: Depression, unspecified: F32.A

## 2019-09-15 HISTORY — DX: Anemia, unspecified: D64.9

## 2019-09-18 ENCOUNTER — Encounter: Payer: Self-pay | Admitting: Nurse Practitioner

## 2019-09-28 ENCOUNTER — Ambulatory Visit (INDEPENDENT_AMBULATORY_CARE_PROVIDER_SITE_OTHER): Payer: 59 | Admitting: Nurse Practitioner

## 2019-09-28 ENCOUNTER — Other Ambulatory Visit: Payer: Self-pay

## 2019-09-28 VITALS — BP 98/62 | HR 90 | Temp 97.6°F | Resp 16 | Ht 63.0 in | Wt 119.0 lb

## 2019-09-28 DIAGNOSIS — G4709 Other insomnia: Secondary | ICD-10-CM

## 2019-09-28 DIAGNOSIS — F329 Major depressive disorder, single episode, unspecified: Secondary | ICD-10-CM | POA: Diagnosis not present

## 2019-09-28 DIAGNOSIS — F419 Anxiety disorder, unspecified: Secondary | ICD-10-CM | POA: Diagnosis not present

## 2019-09-28 DIAGNOSIS — F32A Depression, unspecified: Secondary | ICD-10-CM

## 2019-09-28 MED ORDER — BUSPIRONE HCL 5 MG PO TABS: 10.0000 mg | ORAL_TABLET | Freq: Two times a day (BID) | ORAL | Status: DC

## 2019-09-28 NOTE — Progress Notes (Signed)
Established Patient Office Visit  Subjective:  Patient ID: Alexis Mullins, female    DOB: 03-04-1996  Age: 24 y.o. MRN: [id number]  CC:  Chief Complaint  Patient presents with  . Follow-up  . Depression    HPI Alexis Mullins presents for one month follow up for her antidepressant/antianxiety medication. Pt denies suicidal ideations, reports good management with anti depressant but reports uncontrolled anxiety with current medication dosage. Pt reports no new changes in her normal routine.  Past Medical History:  Diagnosis Date  . Anemia   . Depression   . Gestational diabetes   . Medical history non-contributory   . Migraine headache without aura     Past Surgical History:  Procedure Laterality Date  . BACK SURGERY    . TONSILLECTOMY      No family history on file.  Social History   Socioeconomic History  . Marital status: Married    Spouse name: Not on file  . Number of children: Not on file  . Years of education: Not on file  . Highest education level: Not on file  Occupational History  . Not on file  Tobacco Use  . Smoking status: Never Smoker  . Smokeless tobacco: Never Used  Substance and Sexual Activity  . Alcohol use: No  . Drug use: No  . Sexual activity: Not on file  Other Topics Concern  . Not on file  Social History Narrative  . Not on file   Social Determinants of Health   Financial Resource Strain:   . Difficulty of Paying Living Expenses: Not on file  Food Insecurity:   . Worried About Charity fundraiser in the Last Year: Not on file  . Ran Out of Food in the Last Year: Not on file  Transportation Needs:   . Lack of Transportation (Medical): Not on file  . Lack of Transportation (Non-Medical): Not on file  Physical Activity:   . Days of Exercise per Week: Not on file  . Minutes of Exercise per Session: Not on file  Stress:   . Feeling of Stress : Not on file  Social Connections:   . Frequency of Communication with  Friends and Family: Not on file  . Frequency of Social Gatherings with Friends and Family: Not on file  . Attends Religious Services: Not on file  . Active Member of Clubs or Organizations: Not on file  . Attends Archivist Meetings: Not on file  . Marital Status: Not on file  Intimate Partner Violence:   . Fear of Current or Ex-Partner: Not on file  . Emotionally Abused: Not on file  . Physically Abused: Not on file  . Sexually Abused: Not on file    Outpatient Medications Prior to Visit  Medication Sig Dispense Refill  . escitalopram (LEXAPRO) 20 MG tablet Take 20 mg by mouth daily.    Marland Kitchen ibuprofen (ADVIL) 600 MG tablet Take 1 tablet (600 mg total) by mouth every 6 (six) hours. 30 tablet 0  . ondansetron (ZOFRAN) 4 MG tablet Take 4 mg by mouth 2 (two) times daily. TAKE ONE TABLET BID PRN NAUSEA    . Prenatal Vit-Fe Fumarate-FA (PRENATAL MULTIVITAMIN) TABS tablet Take 1 tablet by mouth daily at 12 noon.    Harlin Heys 0.18/0.215/0.25 MG-35 MCG tablet Take 1 tablet by mouth daily.    . busPIRone (BUSPAR) 5 MG tablet Take 5 mg by mouth 2 (two) times daily.    . norgestimate-ethinyl estradiol (ORTHO-CYCLEN) 0.25-35  MG-MCG tablet Take 1 tablet by mouth daily. TAKE ONE TABLET DAILY    . Norgestimate-Ethinyl Estradiol Triphasic (TRI-ESTARYLLA) 0.18/0.215/0.25 MG-35 MCG tablet Tri-Estarylla (28) 0.18 mg(7)/0.215 mg(7)/0.25 mg(7)-35 mcg tablet     No facility-administered medications prior to visit.    Allergies  Allergen Reactions  . Hydromet [Hydrocodone-Homatropine] Nausea Only  . Penicillins Hives and Swelling    Has patient had a PCN reaction causing immediate rash, facial/tongue/throat swelling, SOB or lightheadedness with hypotension: Yes Has patient had a PCN reaction causing severe rash involving mucus membranes or skin necrosis: Yes Has patient had a PCN reaction that required hospitalization: No Has patient had a PCN reaction occurring within the last 10 years:  No If all of the above answers are "NO", then may proceed with Cephalosporin use.     ROS Review of Systems  Constitutional: Negative for activity change and appetite change.  HENT: Negative for congestion.   Respiratory: Negative for chest tightness.   Cardiovascular: Negative for chest pain and palpitations.  Gastrointestinal: Negative for nausea.  Neurological: Negative for weakness.  Psychiatric/Behavioral: Negative for sleep disturbance and suicidal ideas. The patient is nervous/anxious.       Objective:    Physical Exam  Constitutional: She appears well-developed. No distress.  HENT:  Head: Normocephalic.  Right Ear: External ear normal.  Left Ear: External ear normal.  Eyes: Pupils are equal, round, and reactive to light. Conjunctivae and EOM are normal.  Cardiovascular: Normal rate and regular rhythm.  Pulmonary/Chest: Effort normal and breath sounds normal.  Abdominal: Soft. Bowel sounds are normal.  Musculoskeletal:     Cervical back: Neck supple.  Neurological: She is alert.  Skin: She is not diaphoretic.      BP 98/62 (BP Location: Left Arm, Patient Position: Sitting)   Pulse 90   Temp 97.6 F (36.4 C) (Temporal)   Resp 16   Ht 5' 3"  (1.6 m)   Wt 119 lb (54 kg)   BMI 21.08 kg/m  Wt Readings from Last 3 Encounters:  09/28/19 119 lb (54 kg)  08/31/19 116 lb (52.6 kg)  07/12/19 125 lb 4.8 oz (56.8 kg)     Health Maintenance Due  Topic Date Due  . TETANUS/TDAP  02/05/2015  . PAP-Cervical Cytology Screening  02/04/2017  . PAP SMEAR-Modifier  02/04/2017    There are no preventive care reminders to display for this patient.  No results found for: TSH Lab Results  Component Value Date   WBC 16.6 (H) 07/16/2019   HGB 10.2 (L) 07/16/2019   HCT 32.8 (L) 07/16/2019   MCV 78.8 (L) 07/16/2019   PLT 164 07/16/2019   No results found for: NA, K, CHLORIDE, CO2, GLUCOSE, BUN, CREATININE, BILITOT, ALKPHOS, AST, ALT, PROT, ALBUMIN, CALCIUM, ANIONGAP,  EGFR, GFR No results found for: CHOL No results found for: HDL No results found for: LDLCALC No results found for: TRIG No results found for: CHOLHDL No results found for: HGBA1C    Assessment & Plan:   Problem List Items Addressed This Visit    None    Visit Diagnoses    Anxiety and depression    -  Primary   Relevant Medications   busPIRone (BUSPAR) tablet 10 mg      Meds ordered this encounter  Medications  . busPIRone (BUSPAR) tablet 10 mg    Follow-up: No follow-ups on file.    Ivy Lynn, NP

## 2019-09-28 NOTE — Patient Instructions (Addendum)
patient is here for one month follow up of  New medication. Buspirone will be adjusted. Patient reports Lexapro is managing symptoms well. Generalized Anxiety Disorder, Adult Generalized anxiety disorder (GAD) is a mental health disorder. People with this condition constantly worry about everyday events. Unlike normal anxiety, worry related to GAD is not triggered by a specific event. These worries also do not fade or get better with time. GAD interferes with life functions, including relationships, work, and school. GAD can vary from mild to severe. People with severe GAD can have intense waves of anxiety with physical symptoms (panic attacks). What are the causes? The exact cause of GAD is not known. What increases the risk? This condition is more likely to develop in:  Women.  People who have a family history of anxiety disorders.  People who are very shy.  People who experience very stressful life events, such as the death of a loved one.  People who have a very stressful family environment. What are the signs or symptoms? People with GAD often worry excessively about many things in their lives, such as their health and family. They may also be overly concerned about:  Doing well at work.  Being on time.  Natural disasters.  Friendships. Physical symptoms of GAD include:  Fatigue.  Muscle tension or having muscle twitches.  Trembling or feeling shaky.  Being easily startled.  Feeling like your heart is pounding or racing.  Feeling out of breath or like you cannot take a deep breath.  Having trouble falling asleep or staying asleep.  Sweating.  Nausea, diarrhea, or irritable bowel syndrome (IBS).  Headaches.  Trouble concentrating or remembering facts.  Restlessness.  Irritability. How is this diagnosed? Your health care provider can diagnose GAD based on your symptoms and medical history. You will also have a physical exam. The health care provider will ask  specific questions about your symptoms, including how severe they are, when they started, and if they come and go. Your health care provider may ask you about your use of alcohol or drugs, including prescription medicines. Your health care provider may refer you to a mental health specialist for further evaluation. Your health care provider will do a thorough examination and may perform additional tests to rule out other possible causes of your symptoms. To be diagnosed with GAD, a person must have anxiety that:  Is out of his or her control.  Affects several different aspects of his or her life, such as work and relationships.  Causes distress that makes him or her unable to take part in normal activities.  Includes at least three physical symptoms of GAD, such as restlessness, fatigue, trouble concentrating, irritability, muscle tension, or sleep problems. Before your health care provider can confirm a diagnosis of GAD, these symptoms must be present more days than they are not, and they must last for six months or longer. How is this treated? The following therapies are usually used to treat GAD:  Medicine. Antidepressant medicine is usually prescribed for long-term daily control. Antianxiety medicines may be added in severe cases, especially when panic attacks occur.  Talk therapy (psychotherapy). Certain types of talk therapy can be helpful in treating GAD by providing support, education, and guidance. Options include: ? Cognitive behavioral therapy (CBT). People learn coping skills and techniques to ease their anxiety. They learn to identify unrealistic or negative thoughts and behaviors and to replace them with positive ones. ? Acceptance and commitment therapy (ACT). This treatment teaches people how to  be mindful as a way to cope with unwanted thoughts and feelings. ? Biofeedback. This process trains you to manage your body's response (physiological response) through breathing techniques  and relaxation methods. You will work with a therapist while machines are used to monitor your physical symptoms.  Stress management techniques. These include yoga, meditation, and exercise. A mental health specialist can help determine which treatment is best for you. Some people see improvement with one type of therapy. However, other people require a combination of therapies. Follow these instructions at home:  Take over-the-counter and prescription medicines only as told by your health care provider.  Try to maintain a normal routine.  Try to anticipate stressful situations and allow extra time to manage them.  Practice any stress management or self-calming techniques as taught by your health care provider.  Do not punish yourself for setbacks or for not making progress.  Try to recognize your accomplishments, even if they are small.  Keep all follow-up visits as told by your health care provider. This is important. Contact a health care provider if:  Your symptoms do not get better.  Your symptoms get worse.  You have signs of depression, such as: ? A persistently sad, cranky, or irritable mood. ? Loss of enjoyment in activities that used to bring you joy. ? Change in weight or eating. ? Changes in sleeping habits. ? Avoiding friends or family members. ? Loss of energy for normal tasks. ? Feelings of guilt or worthlessness. Get help right away if:  You have serious thoughts about hurting yourself or others. If you ever feel like you may hurt yourself or others, or have thoughts about taking your own life, get help right away. You can go to your nearest emergency department or call:  Your local emergency services (911 in the [place]  A suicide crisis helpline, such as the National Suicide Prevention Lifeline at 709-230-9716. This is open 24 hours a day. Summary  Generalized anxiety disorder (GAD) is a mental health disorder that involves worry that is not triggered by  a specific event.  People with GAD often worry excessively about many things in their lives, such as their health and family.  GAD may cause physical symptoms such as restlessness, trouble concentrating, sleep problems, frequent sweating, nausea, diarrhea, headaches, and trembling or muscle twitching.  A mental health specialist can help determine which treatment is best for you. Some people see improvement with one type of therapy. However, other people require a combination of therapies. This information is not intended to replace advice given to you by your health care provider. Make sure you discuss any questions you have with your health care provider. Document Revised: 07/25/2017 Document Reviewed: 07/02/2016 Elsevier Patient Education  2020 [employer].  No other changes needed. Medication will be sent to pharmacy. Pt should follow up in 3 months.

## 2019-10-07 ENCOUNTER — Other Ambulatory Visit: Payer: Self-pay | Admitting: Family Medicine

## 2019-10-11 ENCOUNTER — Telehealth: Payer: Self-pay

## 2019-10-11 MED ORDER — MELATONIN 1 MG PO CAPS: 1.0000 mg | ORAL_CAPSULE | Freq: Every evening | ORAL | 0 refills | Status: DC | PRN

## 2019-10-11 NOTE — Telephone Encounter (Signed)
Patient called stating that she has been having trouble sleeping at night and wanted to know your recommendation if there is something over the counter that she can take or if there is a medication that you can send in for her to take. She states that her mind does not rest at night and she has trouble going to sleep. Please advise!

## 2019-10-11 NOTE — Progress Notes (Signed)
Pt called. c/o insomnia in the last week. Rx sent to pharmacy. Pt to f/u as needed.

## 2019-10-13 ENCOUNTER — Other Ambulatory Visit: Payer: Self-pay | Admitting: Nurse Practitioner

## 2019-10-13 ENCOUNTER — Telehealth: Payer: Self-pay

## 2019-10-13 DIAGNOSIS — F419 Anxiety disorder, unspecified: Secondary | ICD-10-CM

## 2019-10-13 MED ORDER — BUSPIRONE HCL 10 MG PO TABS: 10.0000 mg | ORAL_TABLET | Freq: Two times a day (BID) | ORAL | 0 refills | Status: DC

## 2019-10-13 NOTE — Telephone Encounter (Signed)
Patient called and left message stating that she needs her refill on the Buspirone but she thought that you would be upping the dose to 10mg  instead of taking two 5mg  tablets instead?

## 2019-10-13 NOTE — Progress Notes (Unsigned)
Rx refill sent to pharmacy. 

## 2019-10-20 ENCOUNTER — Other Ambulatory Visit: Payer: Self-pay | Admitting: Family Medicine

## 2019-11-23 ENCOUNTER — Other Ambulatory Visit: Payer: Self-pay | Admitting: Family Medicine

## 2019-11-23 MED ORDER — NORGESTIM-ETH ESTRAD TRIPHASIC 0.18/0.215/0.25 MG-35 MCG PO TABS: 1.0000 | ORAL_TABLET | Freq: Every day | ORAL | 3 refills | Status: DC

## 2019-12-27 ENCOUNTER — Telehealth (INDEPENDENT_AMBULATORY_CARE_PROVIDER_SITE_OTHER): Payer: 59 | Admitting: Physician Assistant

## 2019-12-27 ENCOUNTER — Encounter: Payer: Self-pay | Admitting: Physician Assistant

## 2019-12-27 ENCOUNTER — Ambulatory Visit: Payer: 59 | Admitting: Nurse Practitioner

## 2019-12-27 VITALS — Temp 98.2°F | Wt 120.2 lb

## 2019-12-27 DIAGNOSIS — J060 Acute laryngopharyngitis: Secondary | ICD-10-CM

## 2019-12-27 HISTORY — DX: Acute laryngopharyngitis: J06.0

## 2019-12-27 MED ORDER — ALBUTEROL SULFATE HFA 108 (90 BASE) MCG/ACT IN AERS: 2.0000 | INHALATION_SPRAY | Freq: Four times a day (QID) | RESPIRATORY_TRACT | 0 refills | Status: DC | PRN

## 2019-12-27 MED ORDER — AZITHROMYCIN 250 MG PO TABS: ORAL_TABLET | ORAL | 0 refills | Status: DC

## 2019-12-27 MED ORDER — BENZONATATE 100 MG PO CAPS
100.0000 mg | ORAL_CAPSULE | Freq: Two times a day (BID) | ORAL | 2 refills | Status: DC | PRN
Start: 2019-12-27 — End: 2020-04-10

## 2019-12-27 NOTE — Progress Notes (Signed)
Virtual Visit via Telephone Note   This visit type was conducted due to national recommendations for restrictions regarding the COVID-19 Pandemic (e.g. social distancing) in an effort to limit this patient's exposure and mitigate transmission in our community.  Due to her co-morbid illnesses, this patient is at least at moderate risk for complications without adequate follow up.  This format is felt to be most appropriate for this patient at this time.  The patient did not have access to video technology/had technical difficulties with video requiring transitioning to audio format only (telephone).  All issues noted in this document were discussed and addressed.  No physical exam could be performed with this format.  Patient verbally consented to a telehealth visit.   Date:  12/27/2019   ID:  Alexis Mullins, DOB Feb 25, 1996, MRN [id number]  Patient Location: Home Provider Location: Office  PCP:  Marge Duncans, PA-C      Chief Complaint:  Alexis Mullins  History of Present Illness:    Alexis Mullins is a 24 y.o. female with uri AND ALLERGIES PT STATES SHE HAS BEEN HAVING TROUBLE WITH SINUS CONGESTION AND COUGH DUE TO HER ALLERGIES - ALWAYS HAS PROBLEMS WITH POLLEN IS NOW HAVING A PRODUCTIVE COUGH AND SOME WHEEZING WHICH SHE USUALLY GETS WITH BRONCHITIS SHE IS NOT HAVING A FEVER OR FLU LIKE SYMPTOMS NO MALAISE/NO FATIGUE/ NO LOSS OF TASTE OR SMELL     Past Medical History:  Diagnosis Date  . Anemia   . Depression   . Gestational diabetes   . Medical history non-contributory   . Migraine headache without aura    Past Surgical History:  Procedure Laterality Date  . BACK SURGERY    . TONSILLECTOMY       No outpatient medications have been marked as taking for the 12/27/19 encounter (Video Visit) with Marge Duncans, PA-C.     Allergies:   Hydromet [hydrocodone-homatropine] and Penicillins   Social History   Tobacco Use  . Smoking status: Never Smoker  . Smokeless tobacco:  Never Used  Substance Use Topics  . Alcohol use: No  . Drug use: No     Family Hx: The patient's family history is not on file.  ROS:   Please see the history of present illness.    All other systems reviewed and are negative.  Labs/Other Tests and Data Reviewed:    Recent Labs: 07/16/2019: Hemoglobin 10.2; Platelets 164   Recent Lipid Panel No results found for: CHOL, TRIG, HDL, CHOLHDL, LDLCALC, LDLDIRECT  Wt Readings from Last 3 Encounters:  12/27/19 120 lb 3.2 oz (54.5 kg)  09/28/19 119 lb (54 kg)  08/31/19 116 lb (52.6 kg)     Objective:    Vital Signs:  Temp 98.2 F (36.8 C)   Wt 120 lb 3.2 oz (54.5 kg)   BMI 21.29 kg/m      ASSESSMENT & PLAN:    1. URI  COVID-19 Education: The signs and symptoms of COVID-19 were discussed with the patient and how to seek care for testing (follow up with PCP or arrange E-visit). The importance of social distancing was discussed today.  Time:   Today, I have spent 10 minutes with the patient with telehealth technology discussing the above problems.     Medication Adjustments/Labs and Tests Ordered: Current medicines are reviewed at length with the patient today.  Concerns regarding medicines are outlined above.   Tests Ordered: No orders of the defined types were placed in this encounter.   Medication Changes:  No orders of the defined types were placed in this encounter.   Follow Up:  In Person prn  Signed, Vickey Sages, PA-C  12/27/2019 10:05 AM    [facility]

## 2019-12-27 NOTE — Assessment & Plan Note (Signed)
RX FOR ZPACK, ALBUTEROL AND TESSALON PERLES FOLLOW UP IF SYMPTOMS PERSIST OR WORSEN

## 2019-12-30 ENCOUNTER — Other Ambulatory Visit: Payer: Self-pay | Admitting: Family Medicine

## 2020-01-16 ENCOUNTER — Other Ambulatory Visit: Payer: Self-pay | Admitting: Physician Assistant

## 2020-02-03 ENCOUNTER — Other Ambulatory Visit: Payer: Self-pay | Admitting: Family Medicine

## 2020-02-14 ENCOUNTER — Other Ambulatory Visit: Payer: Self-pay | Admitting: Family Medicine

## 2020-03-11 ENCOUNTER — Other Ambulatory Visit: Payer: Self-pay | Admitting: Family Medicine

## 2020-04-10 ENCOUNTER — Encounter: Payer: Self-pay | Admitting: Family Medicine

## 2020-04-10 ENCOUNTER — Telehealth (INDEPENDENT_AMBULATORY_CARE_PROVIDER_SITE_OTHER): Payer: 59 | Admitting: Family Medicine

## 2020-04-10 VITALS — Temp 98.3°F | Ht 62.0 in | Wt 115.0 lb

## 2020-04-10 DIAGNOSIS — R059 Cough, unspecified: Secondary | ICD-10-CM | POA: Insufficient documentation

## 2020-04-10 DIAGNOSIS — R05 Cough: Secondary | ICD-10-CM

## 2020-04-10 HISTORY — DX: Cough, unspecified: R05.9

## 2020-04-10 NOTE — Progress Notes (Signed)
I connected with  Alexis Mullins on 04/10/20 by telephone and verified that I am speaking with the correct person using two identifiers.DOB/address   I discussed the limitations of evaluation and management by telephone. The patient expressed understanding and agreed to proceed. Pt was in her car driving from her mother-in-laws house. Provider in clinic.   Established Patient Office Visit  Subjective:  Patient ID: Alexis Mullins, female    DOB: 1996/01/05  Age: 24 y.o. MRN: [id number]  CC:  Chief Complaint  Patient presents with  . Cough    Since 2 days ago  . Wheezing  . Shortness of Breath    HPI [name] presents for s/t and congestion, chest tightness, cough, wheezing, nasal congestion-Friday night and Sat morning first onset of symptoms. No fever.  No diarrhea.  No joint pain.  Pt lives with husband and 2 1/2 son-neither have symptoms.  No COVID vaccine. No h/o asthma.  Headache onset yesterday.  Little appetite. Allergies in the past-takes claritin-does not feel zyrtec or xyzal works. Has not tried Careers adviser. Pt does not take allergy shots  Past Medical History:  Diagnosis Date  . Anemia   . Depression   . Gestational diabetes   . Medical history non-contributory   . Migraine headache without aura     Past Surgical History:  Procedure Laterality Date  . BACK SURGERY    . TONSILLECTOMY      No family history on file.  Social History   Socioeconomic History  . Marital status: Married    Spouse name: Not on file  . Number of children: Not on file  . Years of education: Not on file  . Highest education level: Not on file  Occupational History  . Not on file  Tobacco Use  . Smoking status: Never Smoker  . Smokeless tobacco: Never Used  Substance and Sexual Activity  . Alcohol use: No  . Drug use: No  . Sexual activity: Yes    Partners: Male  Other Topics Concern  . Not on file  Social History Narrative  . Not on file   Social  Determinants of Health   Financial Resource Strain:   . Difficulty of Paying Living Expenses:   Food Insecurity:   . Worried About Programme researcher, broadcasting/film/video in the Last Year:   . Barista in the Last Year:   Transportation Needs:   . Freight forwarder (Medical):   Marland Kitchen Lack of Transportation (Non-Medical):   Physical Activity:   . Days of Exercise per Week:   . Minutes of Exercise per Session:   Stress:   . Feeling of Stress :   Social Connections:   . Frequency of Communication with Friends and Family:   . Frequency of Social Gatherings with Friends and Family:   . Attends Religious Services:   . Active Member of Clubs or Organizations:   . Attends Banker Meetings:   Marland Kitchen Marital Status:   Intimate Partner Violence:   . Fear of Current or Ex-Partner:   . Emotionally Abused:   Marland Kitchen Physically Abused:   . Sexually Abused:     Outpatient Medications Prior to Visit  Medication Sig Dispense Refill  . escitalopram (LEXAPRO) 20 MG tablet Take 20 mg by mouth daily.    Marland Kitchen ibuprofen (ADVIL) 600 MG tablet Take 1 tablet (600 mg total) by mouth every 6 (six) hours. 30 tablet 0  . Norgestimate-Ethinyl Estradiol Triphasic (TRI-ESTARYLLA) 0.18/0.215/0.25 MG-35 MCG  tablet Take 1 tablet by mouth daily. 28 tablet 3  . ondansetron (ZOFRAN) 4 MG tablet Take 4 mg by mouth 2 (two) times daily. TAKE ONE TABLET BID PRN NAUSEA    . albuterol (VENTOLIN HFA) 108 (90 Base) MCG/ACT inhaler TAKE 2 PUFFS BY MOUTH EVERY 6 HOURS AS NEEDED FOR WHEEZE OR SHORTNESS OF BREATH 8 g 1  . azithromycin (ZITHROMAX) 250 MG tablet 2 PO DAY ONE THEN 1 PO DAYS 2-5 6 tablet 0  . benzonatate (TESSALON) 100 MG capsule Take 1 capsule (100 mg total) by mouth 2 (two) times daily as needed for cough. 20 capsule 2  . busPIRone (BUSPAR) 5 MG tablet TAKE 1 TABLET BY MOUTH TWICE A DAY 180 tablet 1  . meclizine (ANTIVERT) 25 MG tablet Take 25 mg by mouth 3 (three) times daily as needed.    . Melatonin 1 MG CAPS Take 1  capsule (1 mg total) by mouth at bedtime as needed. 30 capsule 0   No facility-administered medications prior to visit.    Allergies  Allergen Reactions  . Hydromet [Hydrocodone-Homatropine] Nausea Only  . Penicillins Hives and Swelling    Has patient had a PCN reaction causing immediate rash, facial/tongue/throat swelling, SOB or lightheadedness with hypotension: Yes Has patient had a PCN reaction causing severe rash involving mucus membranes or skin necrosis: Yes Has patient had a PCN reaction that required hospitalization: No Has patient had a PCN reaction occurring within the last 10 years: No If all of the above answers are "NO", then may proceed with Cephalosporin use.     ROS Review of Systems  Constitutional: Negative for fatigue and fever.  HENT: Positive for congestion.   Respiratory: Positive for cough, chest tightness, shortness of breath and wheezing.   Gastrointestinal: Negative for diarrhea and nausea.  Genitourinary: Negative for dysuria.  Allergic/Immunologic: Positive for environmental allergies.  Psychiatric/Behavioral: Positive for agitation.      Objective:   telephone visit Physical Exam No exam-discussion with patient for 9 min-symptoms, history of allergies-NO DIAGNOSIS of asthma per pt. Has used an inhaler in the past Wt Readings from Last 3 Encounters:  04/10/20 115 lb (52.2 kg)  12/27/19 120 lb 3.2 oz (54.5 kg)  09/28/19 119 lb (54 kg)     Health Maintenance Due  Topic Date Due  . Hepatitis C Screening  Never done  . TETANUS/TDAP  Never done  . PAP-Cervical Cytology Screening  Never done  . PAP SMEAR-Modifier  Never done  . INFLUENZA VACCINE  03/26/2020    Lab Results  Component Value Date   WBC 16.6 (H) 07/16/2019   HGB 10.2 (L) 07/16/2019   HCT 32.8 (L) 07/16/2019   MCV 78.8 (L) 07/16/2019   PLT 164 07/16/2019    Assessment & Plan:   1. Cough D/w pt concern for COVID-pt is unvaccinated for COVID and has a  sore throat, chest  tightness and congestion. Pt has a h/o of allergies and uses claritin. Pt resistant to COVID testing. Pt has used [facility]I in the past for allergic symptoms.DIscussed concern for pts history of URI symptoms and care of young child with current symptoms.  Follow-up: COVID testing-if negative consider treatment for allergy induced asthma with further testing for trigger. Suggested allegra for allergies.  Yacob Wilkerson Mat Carne, MD

## 2020-05-30 ENCOUNTER — Other Ambulatory Visit: Payer: Self-pay | Admitting: Legal Medicine

## 2020-05-30 NOTE — Telephone Encounter (Signed)
This is your patient lp 

## 2020-06-12 ENCOUNTER — Other Ambulatory Visit: Payer: Self-pay | Admitting: Legal Medicine

## 2020-07-04 ENCOUNTER — Other Ambulatory Visit: Payer: Self-pay | Admitting: Legal Medicine

## 2020-07-07 ENCOUNTER — Ambulatory Visit (INDEPENDENT_AMBULATORY_CARE_PROVIDER_SITE_OTHER): Payer: 59 | Admitting: Nurse Practitioner

## 2020-07-07 ENCOUNTER — Encounter: Payer: Self-pay | Admitting: Nurse Practitioner

## 2020-07-07 ENCOUNTER — Other Ambulatory Visit: Payer: Self-pay

## 2020-07-07 VITALS — BP 98/62 | HR 72 | Temp 98.0°F | Ht 62.0 in | Wt 118.0 lb

## 2020-07-07 DIAGNOSIS — R062 Wheezing: Secondary | ICD-10-CM

## 2020-07-07 DIAGNOSIS — R0600 Dyspnea, unspecified: Secondary | ICD-10-CM

## 2020-07-07 DIAGNOSIS — R0609 Other forms of dyspnea: Secondary | ICD-10-CM

## 2020-07-07 MED ORDER — IPRATROPIUM-ALBUTEROL 0.5-2.5 (3) MG/3ML IN SOLN: 3.0000 mL | Freq: Once | RESPIRATORY_TRACT | Status: DC

## 2020-07-07 MED ORDER — BREO ELLIPTA 200-25 MCG/INH IN AEPB: 1.0000 | INHALATION_SPRAY | Freq: Once | RESPIRATORY_TRACT | 0 refills | Status: AC

## 2020-07-07 NOTE — Progress Notes (Signed)
Subjective:  Patient ID: Alexis Mullins, female    DOB: 12-02-1995  Age: 24 y.o. MRN: [id number]  Chief Complaint  Patient presents with  . Wants to discuss Pulmonary referral    HPI  [name] is a 24 year old Caucasian female present to discuss referral to pulmonology for new onset wheezing, dyspnea,cough, and recurrent bronchitis. Patient denies personal or family history of seasonal allergies (review of past medical record states she has history of seasonal allergies) or asthma. States she has been to Urgent Care twice and [facility] once during the past 17-months for treatment. Treatment includes Prednisone, antibiotics, and Proair inhaler. Has requested referral to pulmonology for treatment. She states she does have a new puppy and a total of 3 dogs that live outdoors. Denies exposure to new chemicals or allergens as possible triggers to symptoms.Denies cigarette or marijuana smoking or vaping. Pt has refused Singulair prescription today to decrease symptoms, disagrees with necessity. She has not received seasonal flu or COVID-19 vaccines.    Current Outpatient Medications on File Prior to Visit  Medication Sig Dispense Refill  . albuterol (VENTOLIN HFA) 108 (90 Base) MCG/ACT inhaler Inhale into the lungs.    Marland Kitchen escitalopram (LEXAPRO) 20 MG tablet Take 20 mg by mouth daily.    Marland Kitchen ibuprofen (ADVIL) 600 MG tablet Take 1 tablet (600 mg total) by mouth every 6 (six) hours. 30 tablet 0  . ondansetron (ZOFRAN) 4 MG tablet Take 4 mg by mouth 2 (two) times daily. TAKE ONE TABLET BID PRN NAUSEA    . predniSONE (STERAPRED UNI-PAK 21 TAB) 10 MG (21) TBPK tablet Take by mouth.    Samuel Jester 0.18/0.215/0.25 MG-35 MCG tablet TAKE 1 TABLET BY MOUTH EVERY DAY 84 tablet 1   No current facility-administered medications on file prior to visit.   Past Medical History:  Diagnosis Date  . Anemia   . Depression   . Gestational diabetes   . Medical history non-contributory   . Migraine headache  without aura    Past Surgical History:  Procedure Laterality Date  . BACK SURGERY    . TONSILLECTOMY      History reviewed. No pertinent family history. Social History   Socioeconomic History  . Marital status: Married    Spouse name: Not on file  . Number of children: Not on file  . Years of education: Not on file  . Highest education level: Not on file  Occupational History  . Not on file  Tobacco Use  . Smoking status: Never Smoker  . Smokeless tobacco: Never Used  Substance and Sexual Activity  . Alcohol use: No  . Drug use: No  . Sexual activity: Yes    Partners: Male  Other Topics Concern  . Not on file  Social History Narrative  . Not on file   Social Determinants of Health   Financial Resource Strain:   . Difficulty of Paying Living Expenses: Not on file  Food Insecurity:   . Worried About Programme researcher, broadcasting/film/video in the Last Year: Not on file  . Ran Out of Food in the Last Year: Not on file  Transportation Needs:   . Lack of Transportation (Medical): Not on file  . Lack of Transportation (Non-Medical): Not on file  Physical Activity:   . Days of Exercise per Week: Not on file  . Minutes of Exercise per Session: Not on file  Stress:   . Feeling of Stress : Not on file  Social Connections:   . Frequency  of Communication with Friends and Family: Not on file  . Frequency of Social Gatherings with Friends and Family: Not on file  . Attends Religious Services: Not on file  . Active Member of Clubs or Organizations: Not on file  . Attends Banker Meetings: Not on file  . Marital Status: Not on file    Review of Systems  Constitutional: Negative for fatigue and fever.  HENT: Negative for congestion, ear pain, sinus pressure and sore throat.   Eyes: Negative for pain.  Respiratory: Positive for cough, chest tightness, shortness of breath and wheezing.   Cardiovascular: Negative for chest pain and palpitations.  Gastrointestinal: Negative for  abdominal pain, constipation, diarrhea, nausea and vomiting.  Genitourinary: Negative for dysuria and hematuria.  Musculoskeletal: Negative for arthralgias, back pain, joint swelling and myalgias.  Skin: Negative for rash.  Allergic/Immunologic: Negative for environmental allergies and food allergies.  Neurological: Negative for dizziness, weakness and headaches.  Psychiatric/Behavioral: Negative for dysphoric mood. The patient is not nervous/anxious.      Objective:  BP 98/62 (BP Location: Left Arm, Patient Position: Sitting)   Pulse 72   Temp 98 F (36.7 C) (Temporal)   Ht 5\' 2"  (1.575 m)   Wt 118 lb (53.5 kg)   SpO2 97%   BMI 21.58 kg/m   BP/Weight 07/07/2020 04/10/2020 12/27/2019  Systolic BP 98 - -  Diastolic BP 62 - -  Wt. (Lbs) 118 115 120.2  BMI 21.58 21.03 21.29    Physical Exam Constitutional:      Appearance: Normal appearance.  HENT:     Right Ear: Tympanic membrane, ear canal and external ear normal.     Left Ear: Tympanic membrane, ear canal and external ear normal.     Nose: Nose normal.     Mouth/Throat:     Mouth: Mucous membranes are moist.  Cardiovascular:     Rate and Rhythm: Normal rate and regular rhythm.     Pulses: Normal pulses.     Heart sounds: Normal heart sounds.  Pulmonary:     Effort: Pulmonary effort is normal. No respiratory distress.     Breath sounds: Wheezing and rhonchi present.     Comments: Inspiratory and expiratory wheezes and rhonchi noted in all lobes  Abdominal:     General: Bowel sounds are normal.     Palpations: Abdomen is soft.  Musculoskeletal:        General: Normal range of motion.     Cervical back: Normal range of motion.  Skin:    General: Skin is warm and dry.     Capillary Refill: Capillary refill takes less than 2 seconds.  Neurological:     General: No focal deficit present.     Mental Status: She is alert and oriented to person, place, and time.  Psychiatric:        Mood and Affect: Mood normal.         Behavior: Behavior normal.        Thought Content: Thought content normal.        Judgment: Judgment normal.               Lab Results  Component Value Date   WBC 16.6 (H) 07/16/2019   HGB 10.2 (L) 07/16/2019   HCT 32.8 (L) 07/16/2019   PLT 164 07/16/2019   INR 1.08 08/25/2017      Assessment & Plan:   1. Wheezing on inspiration - Peak flow meter (215 L/min pre Duoneb, 225 L/min  post-Duoneb treatment) - ipratropium-albuterol (DUONEB) 0.5-2.5 (3) MG/3ML nebulizer solution 3 mL - fluticasone furoate-vilanterol (BREO ELLIPTA) 200-25 MCG/INH AEPB; Inhale 1 puff into the lungs once for 1 dose.  Dispense: 1 each; Refill: 0 - Ambulatory referral to Allergy  2. Dyspnea on exertion - Peak flow meter - ipratropium-albuterol (DUONEB) 0.5-2.5 (3) MG/3ML nebulizer solution 3 mL - fluticasone furoate-vilanterol (BREO ELLIPTA) 200-25 MCG/INH AEPB; Inhale 1 puff into the lungs once for 1 dose.  Dispense: 1 each; Refill: 0 - Ambulatory referral to Allergy   I spent < 30 minutes> dedicated to the care of this patient on the date of this encounter to include face-to-face time with the patient, as well as  Preparing to see the patient (eg, review of tests). Obtaining and/or reviewing separately obtained history. Performing a medically appropriate examination and/or evaluation. Counseling and educating the patient/family/caregiver. Ordering medications, tests, or procedures. Communicating with other health care professionals (not separately reported). Documenting clinical information in the electronic or other health record. Independently interpreting results (not separately reported) and communicating results to the patient/family/caregiver.  My nursing staff have aided in the documentation of this note on the behalf of Janie Morning, NP,as directed by  Janie Morning, NP and thoroughly reviewed by Janie Morning, NP.  Complete prednisone as prescribed Begin Breo Ellipta inhaler  once daily-rinse mouth with water afterwards Continue rescue inhaler as needed every 4 hours Appointment Monday, November 15th, 2021 at 8:00 [facility] [place], [place] ([address]) Do not take antihistamines for 3 days prior to appointment (Zyrtec, Claritin, Benadryl etc) Seek emergency medical care for severe shortness of breath  Follow-up: PRN  An After Visit Summary was printed and given to the patient.  Janie Morning, NP [facility] [phone]

## 2020-07-07 NOTE — Patient Instructions (Addendum)
Complete prednisone as prescribed Begin Breo Ellipta inhaler once daily-rinse mouth with water afterwards Continue rescue inhaler as needed every 4 hours Appointment Monday, November 15th, 2021 at 8:00 [facility] [place], [place] ([address]) Do not take antihistamines for 3 days prior to appointment (Zyrtec, Claritin, Benadryl etc) Seek emergency medical care for severe shortness of breath  Fluticasone; Vilanterol inhalation powder What is this medicine? FLUTICASONE; VILANTEROL (floo TIK a sone; vye LAN ter ol) inhalation is a combination of two medicines that decrease inflammation and help to open up the airways of your lungs. It is for chronic obstructive pulmonary disease (COPD), including chronic bronchitis or emphysema. It is also used for asthma in adults to help control symptoms. Do NOT use for an acute asthma attack or COPD attack. This medicine may be used for other purposes; ask your health care provider or pharmacist if you have questions. COMMON BRAND NAME(S): BREO ELLIPTA What should I tell my health care provider before I take this medicine? They need to know if you have any of these conditions:  bone problems  diabetes  eye disease, vision problems  immune system problems  heart disease or irregular heartbeat  high blood pressure  infection  pheochromocytoma  seizures  thyroid disease  an unusual or allergic reaction to fluticasone, vilanterol, milk proteins, corticosteroids, other medicines, foods, dyes, or preservatives  pregnant or trying to get pregnant  breast-feeding How should I use this medicine? This medicine is inhaled through the mouth. It is used once per day. Follow the directions on the prescription label. Do not use a spacer device with this inhaler. Take your medicine at regular intervals. Do not take your medicine more often than directed. Do not stop taking except on your doctor's advice. Make sure that you are  using your inhaler correctly. Ask you doctor or health care provider if you have any questions. A special MedGuide will be given to you by the pharmacist with each prescription and refill. Be sure to read this information carefully each time. Talk to your pediatrician regarding the use of this medicine in children. Special care may be needed. This medicine is not approved for use in children under 59 years of age. Overdosage: If you think you have taken too much of this medicine contact a poison control center or emergency room at once. NOTE: This medicine is only for you. Do not share this medicine with others. What if I miss a dose? If you miss a dose, use it as soon as you can. If it is almost time for your next dose, use only that dose and continue with your regular schedule. Do not use double or extra doses. What may interact with this medicine? Do not take this medicine with any of the following medications:  cisapride  dofetilide  dronedarone  MAOIs like Carbex, Eldepryl, Marplan, Nardil, and Parnate  pimozide  thioridazine  ziprasidone This medicine may also interact with the following medications:  antiviral medicines for HIV or AIDS  beta-blockers like metoprolol and propranolol  certain medicines for depression, anxiety, or psychotic disturbances  certain medicines for fungal infections like ketoconazole, itraconazole, posaconazole, voriconazole  conivaptan  diuretics  medicines for colds  nefazodone  other medicines for breathing problems  other medicines that prolong the QT interval (cause an abnormal heart rhythm) This list may not describe all possible interactions. Give your health care provider a list of all the medicines, herbs, non-prescription drugs, or dietary supplements you use. Also  tell them if you smoke, drink alcohol, or use illegal drugs. Some items may interact with your medicine. What should I watch for while using this medicine? Visit your  doctor or health care professional for regular checkups. Tell your doctor or health care professional if your symptoms do not get better. Do not use this medicine more than once every 24 hours. NEVER use this medicine for an acute asthma or COPD attack. You should use your short-acting rescue inhalers for this purpose. If your symptoms get worse or if you need your short-acting inhalers more often, call your doctor right away. If you are going to have surgery tell your doctor or health care professional that you are using this medicine. Try not to come in contact with people with the chicken pox or measles. If you do, call your doctor. This medicine may increase blood sugar. Ask your healthcare provider if changes in diet or medicines are needed if you have diabetes. What side effects may I notice from receiving this medicine? Side effects that you should report to your doctor or health care professional as soon as possible:  allergic reactions like skin rash or hives, swelling of the face, lips, or tongue  breathing problems right after inhaling your medicine  chest pain  fast, irregular heartbeat  feeling faint or lightheaded, falls  fever or chills  nausea, vomiting  signs and symptoms of high blood sugar such as being more thirsty or hungry or having to urinate more than normal. You may also feel very tired or have blurry vision. Side effects that usually do not require medical attention (report to your doctor or health care professional if they continue or are bothersome):  cough  headache  nervousness  sore throat  tremor This list may not describe all possible side effects. Call your doctor for medical advice about side effects. You may report side effects to FDA at 1-800-FDA-1088. Where should I keep my medicine? Keep out of the reach of children. Store at room temperature between 15 and 30 degrees C (59 and 86 degrees F). Store in a dry place away from direct heat or  sunlight. Throw away 6 weeks after you remove the inhaler from the foil tray, or after the dose indicator reads 0, whichever comes first. Throw away any unopened packages after the expiration date. NOTE: This sheet is a summary. It may not cover all possible information. If you have questions about this medicine, talk to your doctor, pharmacist, or health care provider.  2020 Elsevier/Gold Standard (2018-05-13 11:19:39) Asthma, Adult  Asthma is a long-term (chronic) condition in which the airways get tight and narrow. The airways are the breathing passages that lead from the nose and mouth down into the lungs. A person with asthma will have times when symptoms get worse. These are called asthma attacks. They can cause coughing, whistling sounds when you breathe (wheezing), shortness of breath, and chest pain. They can make it hard to breathe. There is no cure for asthma, but medicines and lifestyle changes can help control it. There are many things that can bring on an asthma attack or make asthma symptoms worse (triggers). Common triggers include:  Mold.  Dust.  Cigarette smoke.  Cockroaches.  Things that can cause allergy symptoms (allergens). These include animal skin flakes (dander) and pollen from trees or grass.  Things that pollute the air. These may include household cleaners, wood smoke, smog, or chemical odors.  Cold air, weather changes, and wind.  Crying or laughing  hard.  Stress.  Certain medicines or drugs.  Certain foods such as dried fruit, potato chips, and grape juice.  Infections, such as a cold or the flu.  Certain medical conditions or diseases.  Exercise or tiring activities. Asthma may be treated with medicines and by staying away from the things that cause asthma attacks. Types of medicines may include:  Controller medicines. These help prevent asthma symptoms. They are usually taken every day.  Fast-acting reliever or rescue medicines. These quickly  relieve asthma symptoms. They are used as needed and provide short-term relief.  Allergy medicines if your attacks are brought on by allergens.  Medicines to help control the body's defense (immune) system. Follow these instructions at home: Avoiding triggers in your home  Change your heating and air conditioning filter often.  Limit your use of fireplaces and wood stoves.  Get rid of pests (such as roaches and mice) and their droppings.  Throw away plants if you see mold on them.  Clean your floors. Dust regularly. Use cleaning products that do not smell.  Have someone vacuum when you are not home. Use a vacuum cleaner with a HEPA filter if possible.  Replace carpet with wood, tile, or vinyl flooring. Carpet can trap animal skin flakes and dust.  Use allergy-proof pillows, mattress covers, and box spring covers.  Wash bed sheets and blankets every week in hot water. Dry them in a dryer.  Keep your bedroom free of any triggers.  Avoid pets and keep windows closed when things that cause allergy symptoms are in the air.  Use blankets that are made of polyester or cotton.  Clean bathrooms and kitchens with bleach. If possible, have someone repaint the walls in these rooms with mold-resistant paint. Keep out of the rooms that are being cleaned and painted.  Wash your hands often with soap and water. If soap and water are not available, use hand sanitizer.  Do not allow anyone to smoke in your home. General instructions  Take over-the-counter and prescription medicines only as told by your doctor. ? Talk with your doctor if you have questions about how or when to take your medicines. ? Make note if you need to use your medicines more often than usual.  Do not use any products that contain nicotine or tobacco, such as cigarettes and e-cigarettes. If you need help quitting, ask your doctor.  Stay away from secondhand smoke.  Avoid doing things outdoors when allergen counts are  high and when air quality is low.  Wear a ski mask when doing outdoor activities in the winter. The mask should cover your nose and mouth. Exercise indoors on cold days if you can.  Warm up before you exercise. Take time to cool down after exercise.  Use a peak flow meter as told by your doctor. A peak flow meter is a tool that measures how well the lungs are working.  Keep track of the peak flow meter's readings. Write them down.  Follow your asthma action plan. This is a written plan for taking care of your asthma and treating your attacks.  Make sure you get all the shots (vaccines) that your doctor recommends. Ask your doctor about a flu shot and a pneumonia shot.  Keep all follow-up visits as told by your doctor. This is important. Contact a doctor if:  You have wheezing, shortness of breath, or a cough even while taking medicine to prevent attacks.  The mucus you cough up (sputum) is thicker than usual.  The mucus you cough up changes from clear or white to yellow, green, gray, or bloody.  You have problems from the medicine you are taking, such as: ? A rash. ? Itching. ? Swelling. ? Trouble breathing.  You need reliever medicines more than 2-3 times a week.  Your peak flow reading is still at 50-79% of your personal best after following the action plan for 1 hour.  You have a fever. Get help right away if:  You seem to be worse and are not responding to medicine during an asthma attack.  You are short of breath even at rest.  You get short of breath when doing very little activity.  You have trouble eating, drinking, or talking.  You have chest pain or tightness.  You have a fast heartbeat.  Your lips or fingernails start to turn blue.  You are light-headed or dizzy, or you faint.  Your peak flow is less than 50% of your personal best.  You feel too tired to breathe normally. Summary  Asthma is a long-term (chronic) condition in which the airways get  tight and narrow. An asthma attack can make it hard to breathe.  Asthma cannot be cured, but medicines and lifestyle changes can help control it.  Make sure you understand how to avoid triggers and how and when to use your medicines. This information is not intended to replace advice given to you by your health care provider. Make sure you discuss any questions you have with your health care provider. Document Revised: 10/15/2018 Document Reviewed: 09/16/2016 Elsevier Patient Education  2020 [employer].

## 2020-07-10 ENCOUNTER — Other Ambulatory Visit: Payer: Self-pay | Admitting: Legal Medicine

## 2020-12-12 ENCOUNTER — Encounter: Payer: Self-pay | Admitting: Physician Assistant

## 2020-12-12 ENCOUNTER — Ambulatory Visit (INDEPENDENT_AMBULATORY_CARE_PROVIDER_SITE_OTHER): Payer: 59 | Admitting: Physician Assistant

## 2020-12-12 ENCOUNTER — Other Ambulatory Visit: Payer: Self-pay

## 2020-12-12 VITALS — BP 110/6 | HR 81 | Temp 97.0°F | Ht 62.0 in | Wt 119.0 lb

## 2020-12-12 DIAGNOSIS — D508 Other iron deficiency anemias: Secondary | ICD-10-CM | POA: Diagnosis not present

## 2020-12-12 NOTE — Progress Notes (Signed)
Subjective:  Patient ID: Alexis Mullins, female    DOB: 25-Dec-1995  Age: 25 y.o. MRN: [id number]  Chief Complaint  Patient presents with  . Fatigue    C/o feeling nauseated and cold all the time. States she has a history of being anemic. Iron supplements sick and nauseated.    HPI  pt states that she has had a history of iron deficiency anemia and supposed to be taking supplements however the regular oral iron and slow release iron cause her too much nausea and she is not able to take- states she has been feeling tired, cold at times Her LMP was last month and normal - is still on birth control pills  Current Outpatient Medications on File Prior to Visit  Medication Sig Dispense Refill  . albuterol (VENTOLIN HFA) 108 (90 Base) MCG/ACT inhaler Inhale into the lungs.    Marland Kitchen escitalopram (LEXAPRO) 20 MG tablet Take 20 mg by mouth daily.    . ondansetron (ZOFRAN) 4 MG tablet Take 4 mg by mouth 2 (two) times daily. TAKE ONE TABLET BID PRN NAUSEA    . TRI-ESTARYLLA 0.18/0.215/0.25 MG-35 MCG tablet TAKE 1 TABLET BY MOUTH EVERY DAY 84 tablet 6  . levocetirizine (XYZAL) 5 MG tablet SMARTSIG:1 Tablet(s) By Mouth Every Evening     No current facility-administered medications on file prior to visit.   Past Medical History:  Diagnosis Date  . Anemia   . Depression   . Gestational diabetes   . Medical history non-contributory   . Migraine headache without aura    Past Surgical History:  Procedure Laterality Date  . BACK SURGERY    . TONSILLECTOMY      History reviewed. No pertinent family history. Social History   Socioeconomic History  . Marital status: Married    Spouse name: Not on file  . Number of children: Not on file  . Years of education: Not on file  . Highest education level: Not on file  Occupational History  . Not on file  Tobacco Use  . Smoking status: Never Smoker  . Smokeless tobacco: Never Used  Substance and Sexual Activity  . Alcohol use: No  . Drug use:  No  . Sexual activity: Yes    Partners: Male  Other Topics Concern  . Not on file  Social History Narrative  . Not on file   Social Determinants of Health   Financial Resource Strain: Not on file  Food Insecurity: Not on file  Transportation Needs: Not on file  Physical Activity: Not on file  Stress: Not on file  Social Connections: Not on file    Review of Systems CONSTITUTIONAL: see HPI CARDIOVASCULAR: Negative for chest pain, dizziness, palpitations and pedal edema.  RESPIRATORY: Negative for recent cough and dyspnea.  GASTROINTESTINAL: Negative for abdominal pain, acid reflux symptoms, constipation, diarrhea, nausea and vomiting.  NEUROLOGICAL: Negative for dizziness and headaches.  PSYCHIATRIC: Negative for sleep disturbance and to question depression screen.  Negative for depression, negative for anhedonia.       Objective:  BP (!) 110/6   Pulse 81   Temp (!) 97 F (36.1 C)   Ht 5\' 2"  (1.575 m)   Wt 119 lb (54 kg)   LMP 11/20/2020   SpO2 100%   BMI 21.77 kg/m   BP/Weight 12/12/2020 07/07/2020 04/10/2020  Systolic BP 110 98 -  Diastolic BP 6 62 -  Wt. (Lbs) 119 118 115  BMI 21.77 21.58 21.03    Physical Exam PHYSICAL  EXAM:   VS: BP (!) 110/6   Pulse 81   Temp (!) 97 F (36.1 C)   Ht 5\' 2"  (1.575 m)   Wt 119 lb (54 kg)   LMP 11/20/2020   SpO2 100%   BMI 21.77 kg/m   GEN: Well nourished, well developed, in no acute distress  Cardiac: RRR; no murmurs, rubs, or gallops,no edema -  Respiratory:  normal respiratory rate and pattern with no distress - normal breath sounds with no rales, rhonchi, wheezes or rubs Skin: warm and dry, no rash  Psych: euthymic mood, appropriate affect and demeanor  Diabetic Foot Exam - Simple   No data filed      Lab Results  Component Value Date   WBC 16.6 (H) 07/16/2019   HGB 10.2 (L) 07/16/2019   HCT 32.8 (L) 07/16/2019   PLT 164 07/16/2019   INR 1.08 08/25/2017      Assessment & Plan:   1. Other iron  deficiency anemia - CBC with Differential/Platelet - Comprehensive metabolic panel - TSH - Iron, TIBC and Ferritin Panel    No orders of the defined types were placed in this encounter.  Will follow up according to lab results Orders Placed This Encounter  Procedures  . CBC with Differential/Platelet  . Comprehensive metabolic panel  . TSH  . Iron, TIBC and Ferritin Panel     Follow-up: Return if symptoms worsen or fail to improve.  An After Visit Summary was printed and given to the patient.  08/27/2017 [facility] [phone]

## 2020-12-13 ENCOUNTER — Other Ambulatory Visit: Payer: Self-pay | Admitting: Physician Assistant

## 2020-12-13 DIAGNOSIS — D508 Other iron deficiency anemias: Secondary | ICD-10-CM

## 2020-12-13 LAB — CBC WITH DIFFERENTIAL/PLATELET
Basophils Absolute: 0 10*3/uL (ref 0.0–0.2)
Basos: 1 %
EOS (ABSOLUTE): 0.3 10*3/uL (ref 0.0–0.4)
Eos: 4 %
Hematocrit: 35.9 % (ref 34.0–46.6)
Hemoglobin: 11.7 g/dL (ref 11.1–15.9)
Immature Grans (Abs): 0 10*3/uL (ref 0.0–0.1)
Immature Granulocytes: 0 %
Lymphocytes Absolute: 1.9 10*3/uL (ref 0.7–3.1)
Lymphs: 25 %
MCH: 26.1 pg — ABNORMAL LOW (ref 26.6–33.0)
MCHC: 32.6 g/dL (ref 31.5–35.7)
MCV: 80 fL (ref 79–97)
Monocytes Absolute: 0.4 10*3/uL (ref 0.1–0.9)
Monocytes: 5 %
Neutrophils Absolute: 4.9 10*3/uL (ref 1.4–7.0)
Neutrophils: 65 %
Platelets: 192 10*3/uL (ref 150–450)
RBC: 4.49 x10E6/uL (ref 3.77–5.28)
RDW: 16 % — ABNORMAL HIGH (ref 11.7–15.4)
WBC: 7.6 10*3/uL (ref 3.4–10.8)

## 2020-12-13 LAB — IRON,TIBC AND FERRITIN PANEL
Ferritin: 5 ng/mL — ABNORMAL LOW (ref 15–150)
Iron Saturation: 4 % — CL (ref 15–55)
Iron: 21 ug/dL — ABNORMAL LOW (ref 27–159)
Total Iron Binding Capacity: 557 ug/dL (ref 250–450)
UIBC: 536 ug/dL — ABNORMAL HIGH (ref 131–425)

## 2020-12-13 LAB — COMPREHENSIVE METABOLIC PANEL
ALT: 18 IU/L (ref 0–32)
AST: 17 IU/L (ref 0–40)
Albumin/Globulin Ratio: 1.7 (ref 1.2–2.2)
Albumin: 4.5 g/dL (ref 3.9–5.0)
Alkaline Phosphatase: 56 IU/L (ref 44–121)
BUN/Creatinine Ratio: 10 (ref 9–23)
BUN: 8 mg/dL (ref 6–20)
Bilirubin Total: 0.2 mg/dL (ref 0.0–1.2)
CO2: 23 mmol/L (ref 20–29)
Calcium: 10.1 mg/dL (ref 8.7–10.2)
Chloride: 103 mmol/L (ref 96–106)
Creatinine, Ser: 0.77 mg/dL (ref 0.57–1.00)
Globulin, Total: 2.7 g/dL (ref 1.5–4.5)
Glucose: 93 mg/dL (ref 65–99)
Potassium: 4.3 mmol/L (ref 3.5–5.2)
Sodium: 142 mmol/L (ref 134–144)
Total Protein: 7.2 g/dL (ref 6.0–8.5)
eGFR: 110 mL/min/{1.73_m2} (ref >59)

## 2020-12-13 LAB — TSH: TSH: 2.71 u[IU]/mL (ref 0.450–4.500)

## 2020-12-18 ENCOUNTER — Telehealth: Payer: Self-pay | Admitting: Oncology

## 2020-12-18 NOTE — Telephone Encounter (Signed)
Patient referred by Marianne Sofia, PA-C for Iron Def/Anemia.  Appt made for 12/26/20 Labs 2:15 pm - Consult 2:45 pm

## 2020-12-26 ENCOUNTER — Other Ambulatory Visit: Payer: Self-pay

## 2020-12-26 ENCOUNTER — Telehealth: Payer: Self-pay | Admitting: Oncology

## 2020-12-26 ENCOUNTER — Inpatient Hospital Stay: Payer: 59 | Attending: Oncology | Admitting: Oncology

## 2020-12-26 ENCOUNTER — Inpatient Hospital Stay: Payer: 59

## 2020-12-26 ENCOUNTER — Other Ambulatory Visit: Payer: Self-pay | Admitting: Oncology

## 2020-12-26 DIAGNOSIS — D508 Other iron deficiency anemias: Secondary | ICD-10-CM | POA: Diagnosis not present

## 2020-12-26 DIAGNOSIS — D509 Iron deficiency anemia, unspecified: Secondary | ICD-10-CM | POA: Insufficient documentation

## 2020-12-26 NOTE — Telephone Encounter (Signed)
Per 5/3 LOS, patient scheduled for Aug Appt's.  Gave patient Appt Summary 

## 2020-12-26 NOTE — Progress Notes (Signed)
[facility]  [address] [place],  [place]  [address] [phone]  Clinic Day:  12/26/2020  Referring physician: Marianne Sofia, PA-C   HISTORY OF PRESENT ILLNESS:  The patient is a 25 y.o. female  who I was asked to consult upon for iron deficiency anemia.  Recent labs showed a low hemoglobin of 11.7, with a low MCV of 80.  Her iron parameters included a low ferritin of 5, a low serum iron of 21, and elevated TIBC of 557 and a low iron saturation of 4%.  As it pertains to her iron deficiency anemia, the patient claims her menstrual cycles last only 3-4 days.  She denies there being any clots with them.  She denies having other overt forms of blood loss.  She tried oral iron in the past, but tolerated it poorly.  She also complains of feeling tired and cold throughout the day. To her knowledge, there is no family history of anemia or other hematologic disorders.    PAST MEDICAL HISTORY:   Past Medical History:  Diagnosis Date  . Anemia   . Depression   . Gestational diabetes   . Medical history non-contributory   . Migraine headache without aura     PAST SURGICAL HISTORY:   Past Surgical History:  Procedure Laterality Date  . BACK SURGERY    . TONSILLECTOMY      CURRENT MEDICATIONS:   Current Outpatient Medications  Medication Sig Dispense Refill  . albuterol (VENTOLIN HFA) 108 (90 Base) MCG/ACT inhaler Inhale into the lungs.    Marland Kitchen escitalopram (LEXAPRO) 20 MG tablet Take 20 mg by mouth daily.    Marland Kitchen levocetirizine (XYZAL) 5 MG tablet SMARTSIG:1 Tablet(s) By Mouth Every Evening    . ondansetron (ZOFRAN) 4 MG tablet Take 4 mg by mouth 2 (two) times daily. TAKE ONE TABLET BID PRN NAUSEA    . TRI-ESTARYLLA 0.18/0.215/0.25 MG-35 MCG tablet TAKE 1 TABLET BY MOUTH EVERY DAY 84 tablet 6   No current facility-administered medications for this visit.    ALLERGIES:   Allergies  Allergen Reactions  . Hydromet [Hydrocodone Bit-Homatrop Mbr] Nausea  Only  . Penicillins Hives and Swelling    Has patient had a PCN reaction causing immediate rash, facial/tongue/throat swelling, SOB or lightheadedness with hypotension: Yes Has patient had a PCN reaction causing severe rash involving mucus membranes or skin necrosis: Yes Has patient had a PCN reaction that required hospitalization: No Has patient had a PCN reaction occurring within the last 10 years: No If all of the above answers are "NO", then may proceed with Cephalosporin use.     FAMILY HISTORY:  Her mother has thyroid disease.  Her father and sister are healthy.  SOCIAL HISTORY:  The patient was born in [place].  She lives in [place] with her husband of 5 years.  They have 2 children, 1 who is deceased.  She is a stay-at-home mother.  She was a previous CNA.  There is no history of alcohol or tobacco abuse.    REVIEW OF SYSTEMS:  Review of Systems  Constitutional: Positive for fatigue. Negative for fever.       Night sweats  HENT:   Negative for hearing loss and sore throat.   Eyes: Negative for eye problems.  Respiratory: Negative for chest tightness, cough and hemoptysis.   Cardiovascular: Negative for chest pain and palpitations.  Gastrointestinal: Positive for nausea. Negative for abdominal distention, abdominal pain, blood in stool, constipation, diarrhea and vomiting.  Endocrine: Negative for hot flashes.  Genitourinary: Negative for difficulty urinating, dysuria, frequency, hematuria and nocturia.   Musculoskeletal: Negative for arthralgias, back pain, gait problem and myalgias.  Skin: Negative.  Negative for itching and rash.  Neurological: Negative.  Negative for dizziness, extremity weakness, gait problem, headaches, light-headedness and numbness.  Hematological: Negative.   Psychiatric/Behavioral: Positive for depression. Negative for suicidal ideas. The patient is nervous/anxious.      PHYSICAL EXAM:  unknown if currently breastfeeding. Wt Readings from  Last 3 Encounters:  12/12/20 119 lb (54 kg)  07/07/20 118 lb (53.5 kg)  04/10/20 115 lb (52.2 kg)   There is no height or weight on file to calculate BMI. Performance status (ECOG): 0 - Asymptomatic Physical Exam Constitutional:      Appearance: Normal appearance. She is not ill-appearing.  HENT:     Mouth/Throat:     Mouth: Mucous membranes are moist.     Pharynx: Oropharynx is clear. No oropharyngeal exudate or posterior oropharyngeal erythema.  Cardiovascular:     Rate and Rhythm: Normal rate and regular rhythm.     Heart sounds: No murmur heard. No friction rub. No gallop.   Pulmonary:     Effort: Pulmonary effort is normal. No respiratory distress.     Breath sounds: Normal breath sounds. No wheezing, rhonchi or rales.  Chest:  Breasts:     Right: No axillary adenopathy or supraclavicular adenopathy.     Left: No axillary adenopathy or supraclavicular adenopathy.    Abdominal:     General: Bowel sounds are normal. There is no distension.     Palpations: Abdomen is soft. There is no mass.     Tenderness: There is no abdominal tenderness.  Musculoskeletal:        General: No swelling.     Right lower leg: No edema.     Left lower leg: No edema.  Lymphadenopathy:     Cervical: No cervical adenopathy.     Upper Body:     Right upper body: No supraclavicular or axillary adenopathy.     Left upper body: No supraclavicular or axillary adenopathy.     Lower Body: No right inguinal adenopathy. No left inguinal adenopathy.  Skin:    General: Skin is warm.     Coloration: Skin is not jaundiced.     Findings: No lesion or rash.  Neurological:     General: No focal deficit present.     Mental Status: She is alert and oriented to person, place, and time. Mental status is at baseline.     Cranial Nerves: Cranial nerves are intact.  Psychiatric:        Mood and Affect: Mood normal.        Behavior: Behavior normal.        Thought Content: Thought content normal.    LABS:    CBC Latest Ref Rng & Units 12/12/2020 07/16/2019 07/15/2019  WBC 3.4 - 10.8 x10E3/uL 7.6 16.6(H) 12.6(H)  Hemoglobin 11.1 - 15.9 g/dL 24.2 10.2(L) 11.1(L)  Hematocrit 34.0 - 46.6 % 35.9 32.8(L) 36.1  Platelets 150 - 450 x10E3/uL 192 164 190   CMP Latest Ref Rng & Units 12/12/2020  Glucose 65 - 99 mg/dL 93  BUN 6 - 20 mg/dL 8  Creatinine 3.53 - 6.14 mg/dL 4.31  Sodium 540 - 086 mmol/L 142  Potassium 3.5 - 5.2 mmol/L 4.3  Chloride 96 - 106 mmol/L 103  CO2 20 - 29 mmol/L 23  Calcium 8.7 - 10.2 mg/dL 10.1  Total Protein 6.0 - 8.5 g/dL 7.2  Total Bilirubin 0.0 - 1.2 mg/dL 0.2  Alkaline Phos 44 - 121 IU/L 56  AST 0 - 40 IU/L 17  ALT 0 - 32 IU/L 18   Lab Results  Component Value Date   TIBC 557 (HH) 12/12/2020   FERRITIN 5 (L) 12/12/2020   IRONPCTSAT 4 (LL) 12/12/2020    Ref. Range 12/12/2020 15:44  Iron Latest Ref Range: 27 - 159 ug/dL 21 (L)    ASSESSMENT & PLAN:  A 25 y.o. female who I was asked to consult upon for iron deficiency anemia.  More than likely, this is due to her previous pregnancies and monthly menstrual cycles.  There is nothing per her history or physical exam which suggests any ominous disease process is present.  I will arrange for her to receive IV iron over these next few weeks to rapidly replenish her iron stores and improve her hemoglobin.  If she has not had a recent gynecologic appointment for a routine Pap smear/pelvic exam, that would be the only onther recommendation at this time.  Otherwise, I will see her back in 3 months to reassess her iron and hemoglobin levels to see how well she responded to her upcoming IV iron.  The patient understands all the plans discussed today and is in agreement with them.  I do appreciate Marianne Sofia, PA-C for his new consult.   Alexsis Kathman Kirby Funk, MD

## 2020-12-27 ENCOUNTER — Other Ambulatory Visit: Payer: Self-pay | Admitting: Physician Assistant

## 2020-12-27 ENCOUNTER — Telehealth: Payer: Self-pay

## 2020-12-27 MED ORDER — AZITHROMYCIN 250 MG PO TABS: ORAL_TABLET | ORAL | 0 refills | Status: DC

## 2020-12-27 NOTE — Telephone Encounter (Signed)
Patient called and stated she has bronchiolitis and wanted to know if you could send her something in. Patient was last seen 12/12/2020.

## 2020-12-27 NOTE — Telephone Encounter (Signed)
Pt made aware of information, verbalized understanding.  

## 2020-12-27 NOTE — Telephone Encounter (Signed)
Since she was recently in office I will send in antibiotic but if symptoms persist/worsen recommend to follow up for office visit

## 2020-12-29 ENCOUNTER — Telehealth: Payer: Self-pay | Admitting: Oncology

## 2020-12-29 ENCOUNTER — Other Ambulatory Visit: Payer: Self-pay | Admitting: Oncology

## 2020-12-29 NOTE — Telephone Encounter (Signed)
12/29/20 Left msg to call back to schedule iron appts

## 2021-01-01 ENCOUNTER — Telehealth: Payer: Self-pay | Admitting: Oncology

## 2021-01-01 NOTE — Telephone Encounter (Signed)
Patient called to scheduled for 5/10, 5/12, 5/16, 5/18, 5/20 [place] 3:00 pm everytime

## 2021-01-02 ENCOUNTER — Inpatient Hospital Stay: Payer: 59

## 2021-01-02 ENCOUNTER — Other Ambulatory Visit: Payer: Self-pay

## 2021-01-02 VITALS — BP 125/68 | HR 74 | Temp 97.6°F | Resp 18 | Ht 62.0 in | Wt 114.0 lb

## 2021-01-02 DIAGNOSIS — D508 Other iron deficiency anemias: Secondary | ICD-10-CM

## 2021-01-02 DIAGNOSIS — D509 Iron deficiency anemia, unspecified: Secondary | ICD-10-CM | POA: Diagnosis present

## 2021-01-02 MED ORDER — SODIUM CHLORIDE 0.9 % IV SOLN
Freq: Once | INTRAVENOUS | Status: AC
  Filled 2021-01-02: qty 250

## 2021-01-02 MED ORDER — SODIUM CHLORIDE 0.9 % IV SOLN
200.0000 mg | Freq: Once | INTRAVENOUS | Status: AC
  Administered 2021-01-02: 200 mg via INTRAVENOUS
  Filled 2021-01-02: qty 200

## 2021-01-02 NOTE — Progress Notes (Signed)
1633 :PT STABLE AT TIME OF DISCHARGE

## 2021-01-02 NOTE — Patient Instructions (Signed)

## 2021-01-04 ENCOUNTER — Other Ambulatory Visit: Payer: Self-pay

## 2021-01-04 ENCOUNTER — Inpatient Hospital Stay: Payer: 59

## 2021-01-04 VITALS — BP 126/65 | HR 77 | Temp 98.1°F | Resp 18 | Ht 62.0 in | Wt 113.0 lb

## 2021-01-04 DIAGNOSIS — D509 Iron deficiency anemia, unspecified: Secondary | ICD-10-CM | POA: Diagnosis not present

## 2021-01-04 DIAGNOSIS — D508 Other iron deficiency anemias: Secondary | ICD-10-CM

## 2021-01-04 MED ORDER — SODIUM CHLORIDE 0.9 % IV SOLN
200.0000 mg | Freq: Once | INTRAVENOUS | Status: AC
  Administered 2021-01-04: 200 mg via INTRAVENOUS
  Filled 2021-01-04: qty 200

## 2021-01-04 MED ORDER — SODIUM CHLORIDE 0.9 % IV SOLN
Freq: Once | INTRAVENOUS | Status: AC
  Filled 2021-01-04: qty 250

## 2021-01-04 NOTE — Progress Notes (Signed)
Pt d/c stable at 1608 

## 2021-01-04 NOTE — Patient Instructions (Signed)

## 2021-01-08 ENCOUNTER — Other Ambulatory Visit: Payer: Self-pay

## 2021-01-08 ENCOUNTER — Inpatient Hospital Stay: Payer: 59

## 2021-01-08 VITALS — BP 107/61 | HR 73 | Temp 97.8°F | Resp 18 | Ht 62.0 in | Wt 113.1 lb

## 2021-01-08 DIAGNOSIS — D509 Iron deficiency anemia, unspecified: Secondary | ICD-10-CM | POA: Diagnosis not present

## 2021-01-08 DIAGNOSIS — D508 Other iron deficiency anemias: Secondary | ICD-10-CM

## 2021-01-08 MED ORDER — SODIUM CHLORIDE 0.9 % IV SOLN
Freq: Once | INTRAVENOUS | Status: AC
  Filled 2021-01-08: qty 250

## 2021-01-08 MED ORDER — SODIUM CHLORIDE 0.9 % IV SOLN
200.0000 mg | Freq: Once | INTRAVENOUS | Status: AC
  Administered 2021-01-08: 200 mg via INTRAVENOUS
  Filled 2021-01-08: qty 200

## 2021-01-08 NOTE — Progress Notes (Signed)
Pt dc stable at 1608

## 2021-01-08 NOTE — Patient Instructions (Signed)

## 2021-01-10 ENCOUNTER — Inpatient Hospital Stay: Payer: 59

## 2021-01-10 ENCOUNTER — Other Ambulatory Visit: Payer: Self-pay

## 2021-01-10 VITALS — BP 128/70 | HR 88 | Temp 97.9°F | Resp 18 | Ht 62.0 in | Wt 113.0 lb

## 2021-01-10 DIAGNOSIS — D508 Other iron deficiency anemias: Secondary | ICD-10-CM

## 2021-01-10 DIAGNOSIS — D509 Iron deficiency anemia, unspecified: Secondary | ICD-10-CM | POA: Diagnosis not present

## 2021-01-10 MED ORDER — SODIUM CHLORIDE 0.9 % IV SOLN
200.0000 mg | Freq: Once | INTRAVENOUS | Status: AC
  Administered 2021-01-10: 200 mg via INTRAVENOUS
  Filled 2021-01-10: qty 200

## 2021-01-10 MED ORDER — SODIUM CHLORIDE 0.9 % IV SOLN
Freq: Once | INTRAVENOUS | Status: AC
  Filled 2021-01-10: qty 250

## 2021-01-10 NOTE — Patient Instructions (Signed)

## 2021-01-10 NOTE — Progress Notes (Signed)
Pt d/c stable at 1608 

## 2021-01-12 ENCOUNTER — Other Ambulatory Visit: Payer: Self-pay

## 2021-01-12 ENCOUNTER — Inpatient Hospital Stay: Payer: 59

## 2021-01-12 VITALS — BP 105/60 | HR 75 | Temp 98.0°F | Resp 16 | Wt 113.1 lb

## 2021-01-12 DIAGNOSIS — D509 Iron deficiency anemia, unspecified: Secondary | ICD-10-CM | POA: Diagnosis not present

## 2021-01-12 DIAGNOSIS — D508 Other iron deficiency anemias: Secondary | ICD-10-CM

## 2021-01-12 MED ORDER — SODIUM CHLORIDE 0.9 % IV SOLN
Freq: Once | INTRAVENOUS | Status: AC
  Filled 2021-01-12: qty 250

## 2021-01-12 MED ORDER — IRON SUCROSE 20 MG/ML IV SOLN
200.0000 mg | Freq: Once | INTRAVENOUS | Status: AC
  Administered 2021-01-12: 200 mg via INTRAVENOUS
  Filled 2021-01-12: qty 200

## 2021-01-12 NOTE — Progress Notes (Signed)
Pt d/c stable at 1558 

## 2021-01-12 NOTE — Patient Instructions (Signed)

## 2021-01-24 ENCOUNTER — Encounter: Payer: Self-pay | Admitting: Oncology

## 2021-02-01 ENCOUNTER — Other Ambulatory Visit: Payer: Self-pay

## 2021-02-01 ENCOUNTER — Telehealth: Payer: Self-pay

## 2021-02-01 DIAGNOSIS — D509 Iron deficiency anemia, unspecified: Secondary | ICD-10-CM

## 2021-02-01 NOTE — Telephone Encounter (Signed)
Pt is asking if her iron levels would drop already from infusions in May? "I felt great right after I got the iron, but Im feeling sluggish again". I told pt it is unlikely that her iron levels have dropped that quickly. I will notify Dr Melvyn Neth for his opinion.   Dr Melvyn Neth agrees to have pt come in for lab check. Pt requests to come in net week, appt given.

## 2021-02-02 ENCOUNTER — Other Ambulatory Visit: Payer: Self-pay | Admitting: Oncology

## 2021-02-02 DIAGNOSIS — D508 Other iron deficiency anemias: Secondary | ICD-10-CM

## 2021-02-06 ENCOUNTER — Encounter: Payer: Self-pay | Admitting: Oncology

## 2021-02-07 ENCOUNTER — Inpatient Hospital Stay: Payer: 59 | Attending: Oncology

## 2021-02-07 ENCOUNTER — Other Ambulatory Visit: Payer: Self-pay

## 2021-02-07 DIAGNOSIS — D509 Iron deficiency anemia, unspecified: Secondary | ICD-10-CM | POA: Insufficient documentation

## 2021-02-07 DIAGNOSIS — D508 Other iron deficiency anemias: Secondary | ICD-10-CM

## 2021-02-07 LAB — IRON AND TIBC
Iron: 121 ug/dL (ref 28–170)
Saturation Ratios: 26 % (ref 10.4–31.8)
TIBC: 474 ug/dL — ABNORMAL HIGH (ref 250–450)
UIBC: 353 ug/dL

## 2021-02-07 LAB — FERRITIN: Ferritin: 67 ng/mL (ref 11–307)

## 2021-02-13 ENCOUNTER — Encounter: Payer: Self-pay | Admitting: Oncology

## 2021-03-15 ENCOUNTER — Encounter: Payer: Self-pay | Admitting: Physician Assistant

## 2021-03-15 ENCOUNTER — Other Ambulatory Visit: Payer: Self-pay

## 2021-03-15 ENCOUNTER — Ambulatory Visit (INDEPENDENT_AMBULATORY_CARE_PROVIDER_SITE_OTHER): Payer: 59 | Admitting: Physician Assistant

## 2021-03-15 ENCOUNTER — Ambulatory Visit: Payer: 59 | Admitting: Physician Assistant

## 2021-03-15 VITALS — BP 104/60 | HR 75 | Temp 97.2°F | Ht 62.0 in | Wt 119.0 lb

## 2021-03-15 DIAGNOSIS — F418 Other specified anxiety disorders: Secondary | ICD-10-CM

## 2021-03-15 HISTORY — DX: Other specified anxiety disorders: F41.8

## 2021-03-15 MED ORDER — BUPROPION HCL ER (XL) 150 MG PO TB24
150.0000 mg | ORAL_TABLET | Freq: Every day | ORAL | 2 refills | Status: DC
Start: 2021-03-15 — End: 2021-04-09

## 2021-03-15 NOTE — Progress Notes (Signed)
Subjective:  Patient ID: Alexis Mullins, female    DOB: October 14, 1995  Age: 25 y.o. MRN: [id number]  Chief Complaint  Patient presents with   Anxiety   Depression    HPI  Pt states that over the past month she has been more depressed and anxious - she is currently taking lexapro 20mg  qd and has been on that medication for over a year  States she has some stressor at home Feeling more overwhelmed recently  Current Outpatient Medications on File Prior to Visit  Medication Sig Dispense Refill   albuterol (VENTOLIN HFA) 108 (90 Base) MCG/ACT inhaler Inhale into the lungs.     escitalopram (LEXAPRO) 20 MG tablet Take 20 mg by mouth daily.     levocetirizine (XYZAL) 5 MG tablet SMARTSIG:1 Tablet(s) By Mouth Every Evening     ondansetron (ZOFRAN) 4 MG tablet Take 4 mg by mouth 2 (two) times daily. TAKE ONE TABLET BID PRN NAUSEA     TRI-ESTARYLLA 0.18/0.215/0.25 MG-35 MCG tablet TAKE 1 TABLET BY MOUTH EVERY DAY 84 tablet 6   No current facility-administered medications on file prior to visit.   Past Medical History:  Diagnosis Date   Anemia    Depression    Gestational diabetes    Medical history non-contributory    Migraine headache without aura    Past Surgical History:  Procedure Laterality Date   BACK SURGERY     TONSILLECTOMY      History reviewed. No pertinent family history. Social History   Socioeconomic History   Marital status: Married    Spouse name: Not on file   Number of children: Not on file   Years of education: Not on file   Highest education level: Not on file  Occupational History   Not on file  Tobacco Use   Smoking status: Never   Smokeless tobacco: Never  Substance and Sexual Activity   Alcohol use: No   Drug use: No   Sexual activity: Yes    Partners: Male  Other Topics Concern   Not on file  Social History Narrative   Not on file   Social Determinants of Health   Financial Resource Strain: Not on file  Food Insecurity: Not on file   Transportation Needs: Not on file  Physical Activity: Not on file  Stress: Not on file  Social Connections: Not on file    Review of Systems CONSTITUTIONAL: Negative for chills, fatigue, fever, unintentional weight gain and unintentional weight loss.   CARDIOVASCULAR: Negative for chest pain, dizziness, palpitations and pedal edema.  RESPIRATORY: Negative for recent cough and dyspnea.   PSYCHIATRIC: see HPI      Objective:  BP 104/60   Pulse 75   Temp (!) 97.2 F (36.2 C)   Ht 5\' 2"  (1.575 m)   Wt 119 lb (54 kg)   SpO2 98%   BMI 21.77 kg/m   BP/Weight 03/15/2021 01/12/2021 01/10/2021  Systolic BP 104 105 128  Diastolic BP 60 60 70  Wt. (Lbs) 119 113.08 113.04  BMI 21.77 20.68 20.68    Physical Exam PHYSICAL EXAM:   VS: BP 104/60   Pulse 75   Temp (!) 97.2 F (36.2 C)   Ht 5\' 2"  (1.575 m)   Wt 119 lb (54 kg)   SpO2 98%   BMI 21.77 kg/m   GEN: Well nourished, well developed, in no acute distress   Cardiac: RRR; no murmurs, rubs, or gallops Respiratory:  normal respiratory rate and pattern with  no distress - normal breath sounds with no rales, rhonchi, wheezes or rubs Psych: euthymic mood, appropriate affect and demeanor  Diabetic Foot Exam - Simple   No data filed      Lab Results  Component Value Date   WBC 7.6 12/12/2020   HGB 11.7 12/12/2020   HCT 35.9 12/12/2020   PLT 192 12/12/2020   GLUCOSE 93 12/12/2020   ALT 18 12/12/2020   AST 17 12/12/2020   NA 142 12/12/2020   K 4.3 12/12/2020   CL 103 12/12/2020   CREATININE 0.77 12/12/2020   BUN 8 12/12/2020   CO2 23 12/12/2020   TSH 2.710 12/12/2020   INR 1.08 08/25/2017      Assessment & Plan:   1. Depression with anxiety - buPROPion (WELLBUTRIN XL) 150 MG 24 hr tablet; Take 1 tablet (150 mg total) by mouth daily.  Dispense: 30 tablet; Refill: 2  Continue lexapro  Meds ordered this encounter  Medications   buPROPion (WELLBUTRIN XL) 150 MG 24 hr tablet    Sig: Take 1 tablet (150 mg  total) by mouth daily.    Dispense:  30 tablet    Refill:  2    Order Specific Question:   Supervising Provider    Answer:   Corey Harold    No orders of the defined types were placed in this encounter.    Follow-up: Return in about 6 weeks (around 04/26/2021) for follow up.  An After Visit Summary was printed and given to the patient.  Jettie Pagan [facility] [phone]

## 2021-03-29 ENCOUNTER — Other Ambulatory Visit: Payer: 59

## 2021-03-29 ENCOUNTER — Ambulatory Visit: Payer: 59 | Admitting: Oncology

## 2021-04-07 ENCOUNTER — Other Ambulatory Visit: Payer: Self-pay | Admitting: Physician Assistant

## 2021-04-07 DIAGNOSIS — F418 Other specified anxiety disorders: Secondary | ICD-10-CM

## 2021-04-12 ENCOUNTER — Inpatient Hospital Stay: Payer: 59 | Attending: Oncology

## 2021-04-12 ENCOUNTER — Inpatient Hospital Stay: Payer: 59 | Admitting: Oncology

## 2021-04-26 ENCOUNTER — Ambulatory Visit: Payer: 59 | Admitting: Physician Assistant

## 2021-05-14 NOTE — Progress Notes (Signed)
[facility]  [address] [place],  [place]  [address] [phone]  Clinic Day:  05/21/2021  Referring physician: Marianne Sofia, PA-C  This document serves as a record of services personally performed by Weston Settle, MD. It was created on their behalf by [facility], a trained medical scribe. The creation of this record is based on the scribe's personal observations and the provider's statements to them.  HISTORY OF PRESENT ILLNESS:  The patient is a 25 y.o. female  with iron deficiency anemia.  She comes in today to reassess her labs after recently receiving IV iron.   As it pertains to her iron deficiency anemia, the patient claims her menstrual cycles still are not particularly heavy.  She denies having other overt forms of blood loss.  She still complains of feeling tired throughout the day.   PHYSICAL EXAM:  Blood pressure 109/63, pulse 72, temperature 98.2 F (36.8 C), temperature source Oral, resp. rate 20, height 5\' 2"  (1.575 m), weight 121 lb 3.2 oz (55 kg), SpO2 99 %, unknown if currently breastfeeding. Wt Readings from Last 3 Encounters:  05/21/21 121 lb 3.2 oz (55 kg)  03/15/21 119 lb (54 kg)  01/12/21 113 lb 1.3 oz (51.3 kg)   Body mass index is 22.17 kg/m. Performance status (ECOG): 0 - Asymptomatic Physical Exam Constitutional:      General: She is not in acute distress.    Appearance: Normal appearance. She is normal weight.  HENT:     Head: Normocephalic and atraumatic.  Eyes:     General: No scleral icterus.    Extraocular Movements: Extraocular movements intact.     Conjunctiva/sclera: Conjunctivae normal.     Pupils: Pupils are equal, round, and reactive to light.  Cardiovascular:     Rate and Rhythm: Normal rate and regular rhythm.     Pulses: Normal pulses.     Heart sounds: Normal heart sounds. No murmur heard.   No friction rub. No gallop.  Pulmonary:     Effort: Pulmonary effort is normal. No respiratory  distress.     Breath sounds: Normal breath sounds.  Abdominal:     General: Bowel sounds are normal. There is no distension.     Palpations: Abdomen is soft. There is no hepatomegaly, splenomegaly or mass.     Tenderness: There is no abdominal tenderness.  Musculoskeletal:        General: Normal range of motion.     Cervical back: Normal range of motion and neck supple.     Right lower leg: No edema.     Left lower leg: No edema.  Lymphadenopathy:     Cervical: No cervical adenopathy.  Skin:    General: Skin is warm and dry.  Neurological:     General: No focal deficit present.     Mental Status: She is alert and oriented to person, place, and time. Mental status is at baseline.  Psychiatric:        Mood and Affect: Mood normal.        Behavior: Behavior normal.        Thought Content: Thought content normal.        Judgment: Judgment normal.   LABS:     Ref. Range 05/21/2021 15:40  Iron Latest Ref Range: 28 - 170 ug/dL 05/23/2021  UIBC Latest Units: ug/dL 315  TIBC Latest Ref Range: 250 - 450 ug/dL 176 (H)  Saturation Ratios Latest Ref Range: 10.4 - 31.8 % 22  Ferritin Latest Ref Range: 11 - 307 ng/mL 74   ASSESSMENT & PLAN:  A 25 y.o. female with iron deficiency anemia. I am pleased as her hemoglobin and iron levels are much better since receiving IV iron.  Clinically, she is doing okay.  I will see her back in 6 months for repeat clinical assessment.  The patient understands all the plans discussed today and is in agreement with them.   I, Foye Deer, am acting as scribe for Weston Settle, MD    I have reviewed this report as typed by the medical scribe, and it is complete and accurate.  Kymber Kosar Kirby Funk, MD

## 2021-05-20 ENCOUNTER — Other Ambulatory Visit: Payer: Self-pay | Admitting: Oncology

## 2021-05-20 DIAGNOSIS — D509 Iron deficiency anemia, unspecified: Secondary | ICD-10-CM

## 2021-05-21 ENCOUNTER — Inpatient Hospital Stay: Payer: 59 | Attending: Oncology | Admitting: Oncology

## 2021-05-21 ENCOUNTER — Encounter: Payer: Self-pay | Admitting: Oncology

## 2021-05-21 ENCOUNTER — Other Ambulatory Visit: Payer: Self-pay | Admitting: Hematology and Oncology

## 2021-05-21 ENCOUNTER — Other Ambulatory Visit: Payer: Self-pay | Admitting: Oncology

## 2021-05-21 ENCOUNTER — Inpatient Hospital Stay: Payer: 59

## 2021-05-21 VITALS — BP 109/63 | HR 72 | Temp 98.2°F | Resp 20 | Ht 62.0 in | Wt 121.2 lb

## 2021-05-21 DIAGNOSIS — D509 Iron deficiency anemia, unspecified: Secondary | ICD-10-CM | POA: Insufficient documentation

## 2021-05-21 DIAGNOSIS — D508 Other iron deficiency anemias: Secondary | ICD-10-CM

## 2021-05-21 LAB — FERRITIN: Ferritin: 74 ng/mL (ref 11–307)

## 2021-05-21 LAB — IRON AND TIBC
Iron: 102 ug/dL (ref 28–170)
Saturation Ratios: 22 % (ref 10.4–31.8)
TIBC: 454 ug/dL — ABNORMAL HIGH (ref 250–450)
UIBC: 352 ug/dL

## 2021-05-21 LAB — CBC AND DIFFERENTIAL
HCT: 41 (ref 36–46)
Hemoglobin: 13.9 (ref 12.0–16.0)
Neutrophils Absolute: 4.64
Platelets: 172 (ref 150–399)
WBC: 7.6

## 2021-05-21 LAB — CBC: RBC: 4.6 (ref 3.87–5.11)

## 2021-05-24 ENCOUNTER — Telehealth: Payer: Self-pay

## 2021-05-24 NOTE — Telephone Encounter (Signed)
Dr. Melvyn Neth reviewed labs and states, "those numbers are fine". I called pt and notified her of Dr Melvyn Neth' response and to f/u in 6 months. Pt verbalized understanding.

## 2021-05-27 ENCOUNTER — Encounter: Payer: Self-pay | Admitting: Oncology

## 2021-05-31 ENCOUNTER — Telehealth: Payer: Self-pay

## 2021-05-31 NOTE — Telephone Encounter (Signed)
Left message for patient to return call.

## 2021-05-31 NOTE — Telephone Encounter (Signed)
Spoke with patient, she states she will give her insurance a call to see what counseling center they will cover and if she needs a referral she will give [place] a call back, no questions or concerns at this time. Patient states her medication is good just wanted to see if Kennon Rounds recommend counseling and patient is willing to go.

## 2021-05-31 NOTE — Telephone Encounter (Signed)
Patient called and stated that she was suppose to let us know how medications are going. She states she has been taking them for a little over a month and they are doing good, just needs to work on doing something for depression

## 2021-06-27 ENCOUNTER — Other Ambulatory Visit: Payer: Self-pay | Admitting: Family Medicine

## 2021-06-27 DIAGNOSIS — F418 Other specified anxiety disorders: Secondary | ICD-10-CM

## 2021-06-27 MED ORDER — BUPROPION HCL ER (XL) 150 MG PO TB24: 150.0000 mg | ORAL_TABLET | Freq: Every day | ORAL | 0 refills | Status: DC

## 2021-07-24 ENCOUNTER — Other Ambulatory Visit: Payer: Self-pay

## 2021-07-25 MED ORDER — ESCITALOPRAM OXALATE 20 MG PO TABS: 20.0000 mg | ORAL_TABLET | Freq: Every day | ORAL | 1 refills | Status: DC

## 2021-08-07 ENCOUNTER — Other Ambulatory Visit: Payer: Self-pay | Admitting: Legal Medicine

## 2021-08-07 ENCOUNTER — Other Ambulatory Visit: Payer: Self-pay | Admitting: Family Medicine

## 2021-08-07 DIAGNOSIS — F418 Other specified anxiety disorders: Secondary | ICD-10-CM

## 2021-09-03 ENCOUNTER — Other Ambulatory Visit: Payer: Self-pay | Admitting: Legal Medicine

## 2021-10-03 ENCOUNTER — Other Ambulatory Visit: Payer: Self-pay | Admitting: Physician Assistant

## 2021-10-03 ENCOUNTER — Other Ambulatory Visit: Payer: Self-pay

## 2021-10-03 MED ORDER — NORGESTIM-ETH ESTRAD TRIPHASIC 0.18/0.215/0.25 MG-35 MCG PO TABS: 1.0000 | ORAL_TABLET | Freq: Every day | ORAL | 0 refills | Status: DC

## 2021-10-03 NOTE — Telephone Encounter (Signed)
Made follow up appointment.   Alexis Mullins, [place] 10/03/21 10:35 AM

## 2021-10-04 ENCOUNTER — Encounter: Payer: Self-pay | Admitting: Physician Assistant

## 2021-10-04 ENCOUNTER — Ambulatory Visit (INDEPENDENT_AMBULATORY_CARE_PROVIDER_SITE_OTHER): Payer: 59 | Admitting: Physician Assistant

## 2021-10-04 ENCOUNTER — Other Ambulatory Visit: Payer: Self-pay

## 2021-10-04 VITALS — BP 110/68 | HR 66 | Temp 98.5°F | Ht 62.0 in | Wt 126.0 lb

## 2021-10-04 DIAGNOSIS — F418 Other specified anxiety disorders: Secondary | ICD-10-CM | POA: Diagnosis not present

## 2021-10-04 MED ORDER — BUPROPION HCL ER (XL) 300 MG PO TB24: 300.0000 mg | ORAL_TABLET | Freq: Every day | ORAL | 2 refills | Status: DC

## 2021-10-04 NOTE — Progress Notes (Signed)
Subjective:  Patient ID: Alexis Mullins, female    DOB: May 28, 1996  Age: 26 y.o. MRN: [id number]  Chief Complaint  Patient presents with   Depression      Pt states that she has been doing really well on her medication until recently after finding a family member that had fallen in the driveway - states she is having worries again about fear of dying and flashbacks of when he son passed - otherwise states she thinks medication had been working well for her  Current Outpatient Medications on File Prior to Visit  Medication Sig Dispense Refill   albuterol (VENTOLIN HFA) 108 (90 Base) MCG/ACT inhaler Inhale into the lungs.     escitalopram (LEXAPRO) 20 MG tablet Take 1 tablet (20 mg total) by mouth daily. 30 tablet 1   levocetirizine (XYZAL) 5 MG tablet SMARTSIG:1 Tablet(s) By Mouth Every Evening     Norgestimate-Ethinyl Estradiol Triphasic (TRI-ESTARYLLA) 0.18/0.215/0.25 MG-35 MCG tablet Take 1 tablet by mouth daily. 84 tablet 0   ondansetron (ZOFRAN) 4 MG tablet Take 4 mg by mouth 2 (two) times daily. TAKE ONE TABLET BID PRN NAUSEA     No current facility-administered medications on file prior to visit.   Past Medical History:  Diagnosis Date   Anemia    Depression    Gestational diabetes    Medical history non-contributory    Migraine headache without aura    Past Surgical History:  Procedure Laterality Date   BACK SURGERY     TONSILLECTOMY      History reviewed. No pertinent family history. Social History   Socioeconomic History   Marital status: Married    Spouse name: Not on file   Number of children: Not on file   Years of education: Not on file   Highest education level: Not on file  Occupational History   Not on file  Tobacco Use   Smoking status: Never   Smokeless tobacco: Never  Substance and Sexual Activity   Alcohol use: No   Drug use: No   Sexual activity: Yes    Partners: Male  Other Topics Concern   Not on file  Social History Narrative    Not on file   Social Determinants of Health   Financial Resource Strain: Not on file  Food Insecurity: Not on file  Transportation Needs: Not on file  Physical Activity: Not on file  Stress: Not on file  Social Connections: Not on file  CONSTITUTIONAL: Negative for chills, fatigue, fever, unintentional weight gain and unintentional weight loss.  CARDIOVASCULAR: Negative for chest pain, dizziness, palpitations and pedal edema.  RESPIRATORY: Negative for recent cough and dyspnea.  GASTROINTESTINAL: Negative for abdominal pain, acid reflux symptoms, constipation, diarrhea, nausea and vomiting.  INTEGUMENTARY: Negative for rash.    Objective:  PHYSICAL EXAM:   VS: BP 110/68 (BP Location: Left Arm, Patient Position: Sitting)    Pulse 66    Temp 98.5 F (36.9 C) (Oral)    Ht 5\' 2"  (1.575 m)    Wt 126 lb (57.2 kg)    SpO2 98%    BMI 23.05 kg/m   GEN: Well nourished, well developed, in no acute distress  Cardiac: RRR; no murmurs,  Respiratory:  normal respiratory rate and pattern with no distress - normal breath sounds with no rales, rhonchi, wheezes or rubs Psych: euthymic mood, appropriate affect and demeanor  Diabetic Foot Exam - Simple   No data filed      Lab Results  Component Value  Date   WBC 7.6 05/21/2021   HGB 13.9 05/21/2021   HCT 41 05/21/2021   PLT 172 05/21/2021   GLUCOSE 93 12/12/2020   ALT 18 12/12/2020   AST 17 12/12/2020   NA 142 12/12/2020   K 4.3 12/12/2020   CL 103 12/12/2020   CREATININE 0.77 12/12/2020   BUN 8 12/12/2020   CO2 23 12/12/2020   TSH 2.710 12/12/2020   INR 1.08 08/25/2017      Assessment & Plan:   1. Depression with anxiety - buPROPion (WELLBUTRIN XL) 300 MG 24 hr tablet; Take 1 tablet (300 mg total) by mouth daily.  Dispense: 30 tablet; Refill: 2  Continue lexapro  Meds ordered this encounter  Medications   buPROPion (WELLBUTRIN XL) 300 MG 24 hr tablet    Sig: Take 1 tablet (300 mg total) by mouth daily.    Dispense:  30  tablet    Refill:  2    Order Specific Question:   Supervising Provider    Answer:   Shelton Silvas    No orders of the defined types were placed in this encounter.    Follow-up: No follow-ups on file.  An After Visit Summary was printed and given to the patient.  Yetta Flock [facility] [phone]

## 2021-10-23 ENCOUNTER — Telehealth: Payer: Self-pay

## 2021-10-23 NOTE — Telephone Encounter (Signed)
General/Other - medication management  Recently increased Wellbutrin to 300 mg. Once starting this she became more anxious. She has now stopped wellbutrin 300 mg and has continued lexapro. She is feeling fine only taking lexapro.   Alexis Mullins, [place] 10/23/21 10:54 AM

## 2021-11-02 ENCOUNTER — Other Ambulatory Visit: Payer: Self-pay | Admitting: Physician Assistant

## 2021-11-02 DIAGNOSIS — F418 Other specified anxiety disorders: Secondary | ICD-10-CM

## 2021-11-02 MED ORDER — BUPROPION HCL ER (XL) 300 MG PO TB24: 300.0000 mg | ORAL_TABLET | Freq: Every day | ORAL | 2 refills | Status: DC

## 2021-11-18 NOTE — Progress Notes (Signed)
?[facility]  ?[address] ?[place],  [place]  [address] ?[phone] ? ?Clinic Day:  11/19/2021 ? ? ?Referring physician: Marianne Sofia, PA-C ? ? ?HISTORY OF PRESENT ILLNESS:  ?The patient is a 26 y.o. female with iron deficiency anemia.  In the past, IV iron was successful in replenishing her iron stores and normalizing her hemoglobin.She comes in today to reassess her labs.   As it pertains to her history of iron deficiency anemia, the patient claims her menstrual cycles still are not particularly heavy.  She denies having other overt forms of blood loss. ? ?PHYSICAL EXAM:  ?Blood pressure (!) 115/96, pulse 86, temperature 98.6 ?F (37 ?C), resp. rate 14, height 5\' 2"  (1.575 m), weight 119 lb (54 kg), SpO2 99 %, unknown if currently breastfeeding. ?Wt Readings from Last 3 Encounters:  ?11/19/21 119 lb (54 kg)  ?10/04/21 126 lb (57.2 kg)  ?05/21/21 121 lb 3.2 oz (55 kg)  ? ?Body mass index is 21.77 kg/m?05/23/21 ?Performance status (ECOG): 0 - Asymptomatic ?Physical Exam ?Constitutional:   ?   General: She is not in acute distress. ?   Appearance: Normal appearance. She is normal weight.  ?HENT:  ?   Head: Normocephalic and atraumatic.  ?Eyes:  ?   General: No scleral icterus. ?   Extraocular Movements: Extraocular movements intact.  ?   Conjunctiva/sclera: Conjunctivae normal.  ?   Pupils: Pupils are equal, round, and reactive to light.  ?Cardiovascular:  ?   Rate and Rhythm: Normal rate and regular rhythm.  ?   Pulses: Normal pulses.  ?   Heart sounds: Normal heart sounds. No murmur heard. ?  No friction rub. No gallop.  ?Pulmonary:  ?   Effort: Pulmonary effort is normal. No respiratory distress.  ?   Breath sounds: Normal breath sounds.  ?Abdominal:  ?   General: Bowel sounds are normal. There is no distension.  ?   Palpations: Abdomen is soft. There is no hepatomegaly, splenomegaly or mass.  ?   Tenderness: There is no abdominal tenderness.  ?Musculoskeletal:     ?   General: Normal  range of motion.  ?   Cervical back: Normal range of motion and neck supple.  ?   Right lower leg: No edema.  ?   Left lower leg: No edema.  ?Lymphadenopathy:  ?   Cervical: No cervical adenopathy.  ?Skin: ?   General: Skin is warm and dry.  ?Neurological:  ?   General: No focal deficit present.  ?   Mental Status: She is alert and oriented to person, place, and time. Mental status is at baseline.  ?Psychiatric:     ?   Mood and Affect: Mood normal.     ?   Behavior: Behavior normal.     ?   Thought Content: Thought content normal.     ?   Judgment: Judgment normal.  ? ?LABS:  ? ? ? ? Latest Reference Range & Units 11/19/21 15:45  ?Iron 28 - 170 ug/dL 63  ?UIBC ug/dL 11/21/21  ?TIBC 250 - 450 ug/dL 119  ?Saturation Ratios 10.4 - 31.8 % 15  ?Ferritin 11 - 307 ng/mL 43  ? ?ASSESSMENT & PLAN:  ?A 26 y.o. female with iron deficiency anemia. I am pleased as her hemoglobin and iron levels remain ideal.  Clinically, she is doing well.  As that is the case, I do feel comfortable turning her care back over to her primary care office with  recommendation that her hemoglobin and iron levels be checked at least once every 6 months.  I would not have a problem seeing her back in the future if iron deficiency redevelops to where more IV iron needs to be considered.  The patient understands all the plans discussed today and is in agreement with them. ? ?Alexis Mullins Kirby Funk, MD ? ? ? ?  ?

## 2021-11-19 ENCOUNTER — Inpatient Hospital Stay: Payer: 59 | Attending: Oncology

## 2021-11-19 ENCOUNTER — Other Ambulatory Visit: Payer: Self-pay

## 2021-11-19 ENCOUNTER — Inpatient Hospital Stay (INDEPENDENT_AMBULATORY_CARE_PROVIDER_SITE_OTHER): Payer: 59 | Admitting: Oncology

## 2021-11-19 VITALS — BP 115/96 | HR 86 | Temp 98.6°F | Resp 14 | Ht 62.0 in | Wt 119.0 lb

## 2021-11-19 DIAGNOSIS — D509 Iron deficiency anemia, unspecified: Secondary | ICD-10-CM | POA: Diagnosis present

## 2021-11-19 DIAGNOSIS — D500 Iron deficiency anemia secondary to blood loss (chronic): Secondary | ICD-10-CM | POA: Diagnosis not present

## 2021-11-19 LAB — FERRITIN: Ferritin: 43 ng/mL (ref 11–307)

## 2021-11-19 LAB — CBC AND DIFFERENTIAL
HCT: 41 (ref 36–46)
Hemoglobin: 13.8 (ref 12.0–16.0)
Neutrophils Absolute: 3.78
Platelets: 140 10*3/uL — AB (ref 150–400)
WBC: 6

## 2021-11-19 LAB — IRON AND TIBC
Iron: 63 ug/dL (ref 28–170)
Saturation Ratios: 15 % (ref 10.4–31.8)
TIBC: 425 ug/dL (ref 250–450)
UIBC: 362 ug/dL

## 2021-11-19 LAB — CBC: RBC: 4.77 (ref 3.87–5.11)

## 2021-11-20 ENCOUNTER — Telehealth: Payer: Self-pay

## 2021-11-20 NOTE — Telephone Encounter (Signed)
Dr Melvyn Neth states, "tell her the numbers are lower, but normal. I will turn her care back over to PCP and recommend that PCP check her levels 1-2 times yearly. If numbers drop again, PCP can reach out to [place] in the future". Pt verbalized understanding. ?

## 2021-11-25 ENCOUNTER — Encounter: Payer: Self-pay | Admitting: Oncology

## 2022-01-02 ENCOUNTER — Other Ambulatory Visit: Payer: Self-pay | Admitting: Physician Assistant

## 2022-02-04 ENCOUNTER — Other Ambulatory Visit: Payer: Self-pay | Admitting: Physician Assistant

## 2022-02-04 MED ORDER — NORGESTIM-ETH ESTRAD TRIPHASIC 0.18/0.215/0.25 MG-35 MCG PO TABS
1.0000 | ORAL_TABLET | Freq: Every day | ORAL | 0 refills | Status: DC
Start: 2022-02-04 — End: 2022-04-28

## 2022-03-25 ENCOUNTER — Other Ambulatory Visit: Payer: Self-pay

## 2022-03-25 MED ORDER — ESCITALOPRAM OXALATE 20 MG PO TABS: 20.0000 mg | ORAL_TABLET | Freq: Every day | ORAL | 0 refills | Status: DC

## 2022-04-03 ENCOUNTER — Ambulatory Visit: Payer: 59 | Admitting: Physician Assistant

## 2022-04-23 ENCOUNTER — Other Ambulatory Visit: Payer: Self-pay | Admitting: Physician Assistant

## 2022-04-23 NOTE — Telephone Encounter (Signed)
Call pt - she is due for follow up --- after making appt will send in refill of medication

## 2022-04-25 NOTE — Telephone Encounter (Signed)
Appointment made.  Alexis Mullins, [place] 04/25/22 9:58 AM

## 2022-04-27 ENCOUNTER — Other Ambulatory Visit: Payer: Self-pay | Admitting: Physician Assistant

## 2022-04-30 ENCOUNTER — Other Ambulatory Visit: Payer: Self-pay

## 2022-04-30 MED ORDER — ESCITALOPRAM OXALATE 20 MG PO TABS: 20.0000 mg | ORAL_TABLET | Freq: Every day | ORAL | 0 refills | Status: DC

## 2022-04-30 MED ORDER — NORGESTIM-ETH ESTRAD TRIPHASIC 0.18/0.215/0.25 MG-35 MCG PO TABS: 1.0000 | ORAL_TABLET | Freq: Every day | ORAL | 0 refills | Status: DC

## 2022-05-07 ENCOUNTER — Ambulatory Visit (INDEPENDENT_AMBULATORY_CARE_PROVIDER_SITE_OTHER): Payer: 59 | Admitting: Physician Assistant

## 2022-05-07 ENCOUNTER — Encounter: Payer: Self-pay | Admitting: Physician Assistant

## 2022-05-07 VITALS — BP 120/60 | HR 84 | Temp 97.4°F | Resp 14 | Ht 62.0 in | Wt 124.0 lb

## 2022-05-07 DIAGNOSIS — F418 Other specified anxiety disorders: Secondary | ICD-10-CM | POA: Diagnosis not present

## 2022-05-07 MED ORDER — ESCITALOPRAM OXALATE 20 MG PO TABS: 20.0000 mg | ORAL_TABLET | Freq: Every day | ORAL | 0 refills | Status: DC

## 2022-05-07 MED ORDER — ONDANSETRON HCL 4 MG PO TABS: 4.0000 mg | ORAL_TABLET | Freq: Two times a day (BID) | ORAL | 0 refills | Status: AC

## 2022-05-07 NOTE — Progress Notes (Signed)
Subjective:  Patient ID: Alexis Mullins, female    DOB: [birth year]  Age: 26 y.o. MRN: [id number]  Chief Complaint  Patient presents with   depression with anxiety    HPI  Pt in for follow up of depression with anxiety - states that she is doing great on her current medication - she is taking lexapro 20mg  qd No breakthrough symptoms -- is caring for grandparent in hospice at this time Current Outpatient Medications on File Prior to Visit  Medication Sig Dispense Refill   albuterol (VENTOLIN HFA) 108 (90 Base) MCG/ACT inhaler Inhale into the lungs.     Norgestimate-Ethinyl Estradiol Triphasic (TRI-ESTARYLLA) 0.18/0.215/0.25 MG-35 MCG tablet Take 1 tablet by mouth daily. 84 tablet 0   No current facility-administered medications on file prior to visit.   Past Medical History:  Diagnosis Date   Anemia    Depression    Gestational diabetes    Medical history non-contributory    Migraine headache without aura    Past Surgical History:  Procedure Laterality Date   BACK SURGERY     TONSILLECTOMY      History reviewed. No pertinent family history. Social History   Socioeconomic History   Marital status: Married    Spouse name: Not on file   Number of children: Not on file   Years of education: Not on file   Highest education level: Not on file  Occupational History   Not on file  Tobacco Use   Smoking status: Never   Smokeless tobacco: Never  Substance and Sexual Activity   Alcohol use: No   Drug use: No   Sexual activity: Yes    Partners: Male  Other Topics Concern   Not on file  Social History Narrative   Not on file   Social Determinants of Health   Financial Resource Strain: Not on file  Food Insecurity: Not on file  Transportation Needs: Not on file  Physical Activity: Not on file  Stress: Not on file  Social Connections: Not on file    Review of Systems CONSTITUTIONAL: Negative for chills, fatigue, fever, unintentional weight gain and  unintentional weight loss.   CARDIOVASCULAR: Negative for chest pain, RESPIRATORY: Negative for recent cough and dyspnea.  GASTROINTESTINAL: Negative for abdominal pain, acid reflux symptoms, constipation, diarrhea, nausea and vomiting.   PSYCHIATRIC: Negative for sleep disturbance and to question depression screen.  Negative for depression, negative for anhedonia.       Objective:  BP 120/60   Pulse 84   Temp (!) 97.4 F (36.3 C)   Resp 14   Ht 5\' 2"  (1.575 m)   Wt 124 lb (56.2 kg)   BMI 22.68 kg/m      05/07/2022    3:18 PM 11/19/2021    3:58 PM 10/04/2021   11:00 AM  BP/Weight  Systolic BP 120 115 110  Diastolic BP 60 96 68  Wt. (Lbs) 124 119 126  BMI 22.68 kg/m2 21.77 kg/m2 23.05 kg/m2    Physical Exam PHYSICAL EXAM:   VS: BP 120/60   Pulse 84   Temp (!) 97.4 F (36.3 C)   Resp 14   Ht 5\' 2"  (1.575 m)   Wt 124 lb (56.2 kg)   BMI 22.68 kg/m   GEN: Well nourished, well developed, in no acute distress   Cardiac: RRR; no murmurs,  Respiratory:  normal respiratory rate and pattern with no distress - normal breath sounds with no rales, rhonchi, wheezes or rubs  Psych: euthymic  mood, appropriate affect and demeanor     05/07/2022    3:20 PM 03/15/2021    3:11 PM 12/12/2020    3:22 PM 09/28/2019   11:59 PM  Depression screen PHQ 2/9  Decreased Interest 0 3 0 1  Down, Depressed, Hopeless 0 3 0 0  PHQ - 2 Score 0 6 0 1  Altered sleeping  2 0 2  Tired, decreased energy  2 0 0  Change in appetite  1 0 0  Feeling bad or failure about yourself   3 0 1  Trouble concentrating  2 0 0  Moving slowly or fidgety/restless  0 0 0  Suicidal thoughts  0 0 0  PHQ-9 Score  16 0 4  Difficult doing work/chores  Somewhat difficult Not difficult at all     Diabetic Foot Exam - Simple   No data filed      Lab Results  Component Value Date   WBC 6.0 11/19/2021   HGB 13.8 11/19/2021   HCT 41 11/19/2021   PLT 140 (A) 11/19/2021   GLUCOSE 93 12/12/2020   ALT 18  12/12/2020   AST 17 12/12/2020   NA 142 12/12/2020   K 4.3 12/12/2020   CL 103 12/12/2020   CREATININE 0.77 12/12/2020   BUN 8 12/12/2020   CO2 23 12/12/2020   TSH 2.710 12/12/2020   INR 1.08 08/25/2017      Assessment & Plan:   Problem List Items Addressed This Visit       Other   Depression with anxiety - Primary   Relevant Medications   escitalopram (LEXAPRO) 20 MG tablet  .  Meds ordered this encounter  Medications   ondansetron (ZOFRAN) 4 MG tablet    Sig: Take 1 tablet (4 mg total) by mouth 2 (two) times daily. TAKE ONE TABLET BID PRN NAUSEA    Dispense:  20 tablet    Refill:  0   escitalopram (LEXAPRO) 20 MG tablet    Sig: Take 1 tablet (20 mg total) by mouth daily.    Dispense:  90 tablet    Refill:  0    Order Specific Question:   Supervising Provider    Answer:   Corey Harold    No orders of the defined types were placed in this encounter.    Follow-up: Return in about 6 months (around 11/05/2022) for chronic follow up.  An After Visit Summary was printed and given to the patient.  Jettie Pagan [facility] [phone]

## 2022-07-22 ENCOUNTER — Other Ambulatory Visit: Payer: Self-pay | Admitting: Physician Assistant

## 2022-07-22 MED ORDER — NORGESTIM-ETH ESTRAD TRIPHASIC 0.18/0.215/0.25 MG-35 MCG PO TABS: 1.0000 | ORAL_TABLET | Freq: Every day | ORAL | 0 refills | Status: DC

## 2022-08-07 ENCOUNTER — Encounter: Payer: Self-pay | Admitting: Oncology

## 2022-08-29 ENCOUNTER — Other Ambulatory Visit: Payer: Self-pay | Admitting: Physician Assistant

## 2022-09-11 ENCOUNTER — Encounter: Payer: Self-pay | Admitting: Oncology

## 2022-09-18 ENCOUNTER — Telehealth: Payer: Self-pay

## 2022-09-18 NOTE — Telephone Encounter (Signed)
Patient called this morning stating that she is working with [employer] on getting her sons appts that are in bad debt sent back to the insurance for him. Patient was notified that her medicaid is out of network and was told to call the insurance company to get the medicaid changed. Per Damon, If the medicaid is changed to something that we do take, we will see Alexis Mullins and she has agreed to pay anywhere between $50-$100.  Note: Alexis Mullins stated that she took a home pregnancy test that had a faint positive and needs to be tested for that. I spoke with [name] about this. Patient was notified if the medicaid is unable to be change in a timely matter, she can try to call the health department to see if they can see her for this and should be able to do the referral.  Patient to call the office back once she has more information.

## 2022-09-19 ENCOUNTER — Encounter: Payer: Self-pay | Admitting: Nurse Practitioner

## 2022-09-19 ENCOUNTER — Ambulatory Visit: Payer: 59 | Admitting: Physician Assistant

## 2022-09-19 ENCOUNTER — Ambulatory Visit (INDEPENDENT_AMBULATORY_CARE_PROVIDER_SITE_OTHER): Payer: 59 | Admitting: Nurse Practitioner

## 2022-09-19 VITALS — BP 118/64 | HR 90 | Temp 97.4°F | Ht 62.0 in | Wt 133.0 lb

## 2022-09-19 DIAGNOSIS — Z3201 Encounter for pregnancy test, result positive: Secondary | ICD-10-CM | POA: Diagnosis not present

## 2022-09-19 DIAGNOSIS — F418 Other specified anxiety disorders: Secondary | ICD-10-CM | POA: Diagnosis not present

## 2022-09-19 DIAGNOSIS — N912 Amenorrhea, unspecified: Secondary | ICD-10-CM | POA: Diagnosis not present

## 2022-09-19 LAB — POCT URINE PREGNANCY: Preg Test, Ur: POSITIVE — AB

## 2022-09-19 MED ORDER — FOLIC ACID 1 MG PO TABS: 1.0000 mg | ORAL_TABLET | Freq: Every day | ORAL | 2 refills | Status: AC

## 2022-09-19 MED ORDER — PNV PRENATAL PLUS MULTIVIT+DHA 27-1 & 312 MG PO MISC: 1.0000 | Freq: Every day | ORAL | 3 refills | Status: DC

## 2022-09-19 MED ORDER — PRENATAL VITAMINS 28-0.8 MG PO TABS: 1.0000 | ORAL_TABLET | Freq: Every day | ORAL | 2 refills | Status: AC

## 2022-09-19 NOTE — Progress Notes (Signed)
Subjective:  Patient ID: Alexis Mullins, female    DOB: 09-Nov-1995  Age: 27 y.o. MRN: [id number]  Chief Complaint  Patient presents with   Possibly Pregnant   Depression    HPI   Patient presents today for depression and pregnancy. LMP 05/2022 and has had two positive home pregnancy tests.She has not contacted ob/gyn for an appt or began prenatal vitamins. She tells me she was not preventing pregnancy, stopped oral contraceptives a few months ago. States she has a young son that is scheduled to start school this year;  lost an infant at 41 weeks old.   Reports history of depression with anxiety. She was previously prescribed Lexapro, unable to tolerate due to GI upset and Buspirone increased anxiety. She is not currently in counseling.        09/19/2022   11:27 AM 05/07/2022    3:20 PM 03/15/2021    3:11 PM  Depression screen PHQ 2/9  Decreased Interest 3 0 3  Down, Depressed, Hopeless 3 0 3  PHQ - 2 Score 6 0 6  Altered sleeping 3  2  Tired, decreased energy 2  2  Change in appetite 2  1  Feeling bad or failure about yourself  1  3  Trouble concentrating 0  2  Moving slowly or fidgety/restless 0  0  Suicidal thoughts 0  0  PHQ-9 Score 14  16  Difficult doing work/chores Somewhat difficult  Somewhat difficult          09/19/2022   11:27 AM 05/07/2022    3:20 PM 03/15/2021    3:11 PM 12/12/2020    3:22 PM 09/28/2019   11:59 PM  Depression screen PHQ 2/9  Decreased Interest 3 0 3 0 1  Down, Depressed, Hopeless 3 0 3 0 0  PHQ - 2 Score 6 0 6 0 1  Altered sleeping 3  2 0 2  Tired, decreased energy 2  2 0 0  Change in appetite 2  1 0 0  Feeling bad or failure about yourself  1  3 0 1  Trouble concentrating 0  2 0 0  Moving slowly or fidgety/restless 0  0 0 0  Suicidal thoughts 0  0 0 0  PHQ-9 Score 14  16 0 4  Difficult doing work/chores Somewhat difficult  Somewhat difficult Not difficult at all          01/10/2021    3:18 PM 01/12/2021    3:12 PM 05/21/2021     4:09 PM 11/19/2021    3:58 PM 09/19/2022   11:28 AM  Fall Risk  Falls in the past year?     0  Was there an injury with Fall?     0  Fall Risk Category Calculator     0  (RETIRED) Patient Fall Risk Level Low fall risk Low fall risk Low fall risk Low fall risk   Patient at Risk for Falls Due to     No Fall Risks  Fall risk Follow up     Falls evaluation completed      Review of Systems  Constitutional:  Negative for chills, fatigue and fever.  HENT:  Negative for congestion, ear pain, rhinorrhea and sore throat.   Respiratory:  Negative for cough and shortness of breath.   Cardiovascular:  Negative for chest pain.  Gastrointestinal:  Negative for abdominal pain, constipation, diarrhea, nausea and vomiting.  Genitourinary:  Positive for menstrual problem (amenorrhea). Negative for dysuria and urgency.  Musculoskeletal:  Negative for back pain and myalgias.  Neurological:  Negative for dizziness, weakness, light-headedness and headaches.  Psychiatric/Behavioral:  Negative for dysphoric mood. The patient is not nervous/anxious.     Current Outpatient Medications on File Prior to Visit  Medication Sig Dispense Refill   albuterol (VENTOLIN HFA) 108 (90 Base) MCG/ACT inhaler Inhale into the lungs.     Norgestimate-Ethinyl Estradiol Triphasic (TRI-ESTARYLLA) 0.18/0.215/0.25 MG-35 MCG tablet Take 1 tablet by mouth daily. (Patient not taking: Reported on 09/19/2022) 84 tablet 0   ondansetron (ZOFRAN) 4 MG tablet Take 1 tablet (4 mg total) by mouth 2 (two) times daily. TAKE ONE TABLET BID PRN NAUSEA 20 tablet 0   No current facility-administered medications on file prior to visit.   Past Medical History:  Diagnosis Date   Anemia    Depression    Gestational diabetes    Medical history non-contributory    Migraine headache without aura    Past Surgical History:  Procedure Laterality Date   BACK SURGERY     TONSILLECTOMY      History reviewed. No pertinent family history. Social  History   Socioeconomic History   Marital status: Married    Spouse name: Not on file   Number of children: Not on file   Years of education: Not on file   Highest education level: Not on file  Occupational History   Not on file  Tobacco Use   Smoking status: Never   Smokeless tobacco: Never  Substance and Sexual Activity   Alcohol use: No   Drug use: No   Sexual activity: Yes    Partners: Male  Other Topics Concern   Not on file  Social History Narrative   Not on file   Social Determinants of Health   Financial Resource Strain: Low Risk  (09/19/2022)   Overall Financial Resource Strain (CARDIA)    Difficulty of Paying Living Expenses: Not hard at all  Food Insecurity: No Food Insecurity (09/19/2022)   Hunger Vital Sign    Worried About Running Out of Food in the Last Year: Never true    [place] in the Last Year: Never true  Transportation Needs: No Transportation Needs (09/19/2022)   PRAPARE - Hydrologist (Medical): No    Lack of Transportation (Non-Medical): No  Physical Activity: Inactive (09/19/2022)   Exercise Vital Sign    Days of Exercise per Week: 0 days    Minutes of Exercise per Session: 0 min  Stress: No Stress Concern Present (09/19/2022)   [place]    Feeling of Stress : Not at all  Social Connections: Moderately Isolated (09/19/2022)   Social Connection and Isolation Panel [NHANES]    Frequency of Communication with Friends and Family: More than three times a week    Frequency of Social Gatherings with Friends and Family: More than three times a week    Attends Religious Services: Never    Marine scientist or Organizations: No    Attends Music therapist: Never    Marital Status: Married    Objective:  BP 118/64   Pulse 90   Temp (!) 97.4 F (36.3 C)   Ht 5\' 2"  (1.575 m)   Wt 133 lb (60.3 kg)   LMP 06/17/2022 (Approximate)    SpO2 97%   BMI 24.33 kg/m      09/19/2022   11:22 AM 05/07/2022    3:18  PM 11/19/2021    3:58 PM  BP/Weight  Systolic BP  194 174  Diastolic BP  60 96  Wt. (Lbs) 133 124 119  BMI 24.33 kg/m2 22.68 kg/m2 21.77 kg/m2    Physical Exam Vitals reviewed.  Constitutional:      Appearance: Normal appearance.  Skin:    General: Skin is warm.  Neurological:     General: No focal deficit present.     Mental Status: She is alert and oriented to person, place, and time.  Psychiatric:        Mood and Affect: Mood normal.        Behavior: Behavior normal.     Lab Results  Component Value Date   WBC 6.0 11/19/2021   HGB 13.8 11/19/2021   HCT 41 11/19/2021   PLT 140 (A) 11/19/2021   GLUCOSE 93 12/12/2020   ALT 18 12/12/2020   AST 17 12/12/2020   NA 142 12/12/2020   K 4.3 12/12/2020   CL 103 12/12/2020   CREATININE 0.77 12/12/2020   BUN 8 12/12/2020   CO2 23 12/12/2020   TSH 2.710 12/12/2020   INR 1.08 08/25/2017      Assessment & Plan:    1. Positive pregnancy test - folic acid (FOLVITE) 1 MG tablet; Take 1 tablet (1 mg total) by mouth daily.  Dispense: 90 tablet; Refill: 2 - Ambulatory referral to Obstetrics / Gynecology - Prenatal Vit-Fe Fumarate-FA (PRENATAL VITAMINS) 28-0.8 MG TABS; Take 1 tablet by mouth daily.  Dispense: 90 tablet; Refill: 2  2. Amenorrhea - POCT urine pregnancy-POSITIVE  3. Depression with anxiety -recommend counseling -discuss pharmacotherapy with PCP and ob/gyn    Follow-up care with ob/gyn as scheduled Consult PCP and ob/gyn for depression management Recommend counseling    Follow-up: PRN  An After Visit Summary was printed and given to the patient.  I, Rip Harbour, NP, have reviewed all documentation for this visit. The documentation on 09/19/22 for the exam, diagnosis, procedures, and orders are all accurate and complete.   Rip Harbour, NP [place] [phone]

## 2022-09-19 NOTE — Patient Instructions (Addendum)
Follow-up care with ob/gyn as scheduled Consult PCP and ob/gyn for depression management Recommend counseling   Common Medications Safe in Pregnancy  Acne:      Constipation:  Benzoyl Peroxide     Colace  Clindamycin      Dulcolax Suppository  Topica Erythromycin     Fibercon  Salicylic Acid      Metamucil         Miralax AVOID:        Senakot   Accutane    Cough:  Retin-A       Cough Drops  Tetracycline      Phenergan w/ Codeine if Rx  Minocycline      Robitussin (Plain & DM)  Antibiotics:     Crabs/Lice:  Ceclor       RID  Cephalosporins    AVOID:  E-Mycins      Kwell  Keflex  Macrobid/Macrodantin   Diarrhea:  Penicillin      Kao-Pectate  Zithromax      Imodium AD         PUSH FLUIDS AVOID:       Cipro     Fever:  Tetracycline      Tylenol (Regular or Extra  Minocycline       Strength)  Levaquin      Extra Strength-Do not          Exceed 8 tabs/24 hrs Caffeine:        200mg /day (equiv. To 1 cup of coffee or  approx. 3 12 oz sodas)         Gas: Cold/Hayfever:       Gas-X  Benadryl      Mylicon  Claritin       Phazyme  **Claritin-D        Chlor-Trimeton    Headaches:  Dimetapp      ASA-Free Excedrin  Drixoral-Non-Drowsy     Cold Compress  Mucinex (Guaifenasin)     Tylenol (Regular or Extra  Sudafed/Sudafed-12 Hour     Strength)  **Sudafed PE Pseudoephedrine   Tylenol Cold & Sinus     Vicks Vapor Rub  Zyrtec  **AVOID if Problems With Blood Pressure         Heartburn: Avoid lying down for at least 1 hour after meals  Aciphex      Maalox     Rash:  Milk of Magnesia     Benadryl    Mylanta       1% Hydrocortisone Cream  Pepcid  Pepcid Complete   Sleep Aids:  Prevacid      Ambien   Prilosec       Benadryl  Rolaids       Chamomile Tea  Tums (Limit 4/day)     Unisom         Tylenol PM         Warm milk-add vanilla or  Hemorrhoids:       Sugar for taste  Anusol/Anusol H.C.  (RX: Analapram 2.5%)  Sugar Substitutes:  Hydrocortisone OTC     Ok in  moderation  Preparation H      Tucks        Vaseline lotion applied to tissue with wiping    Herpes:     Throat:  Acyclovir      Oragel  Famvir  Valtrex     Vaccines:         Flu Shot Leg Cramps:       *Gardasil  Benadryl  Hepatitis A         Hepatitis B Nasal Spray:       Pneumovax  Saline Nasal Spray     Polio Booster         Tetanus Nausea:       Tuberculosis test or PPD  Vitamin B6 25 mg TID   AVOID:    Dramamine      *Gardasil  Emetrol       Live Poliovirus  Ginger Root 250 mg QID    MMR (measles, mumps &  High Complex Carbs @ Bedtime    rebella)  Sea Bands-Accupressure    Varicella (Chickenpox)  Unisom 1/2 tab TID     *No known complications           If received before Pain:         Known pregnancy;   Darvocet       Resume series after  Lortab        Delivery  Percocet    Yeast:   Tramadol      Femstat  Tylenol 3      Gyne-lotrimin  Ultram       Monistat  Vicodin           MISC:         All Sunscreens           Hair Coloring/highlights          Insect Repellant's          (Including DEET)         Mystic Tans

## 2022-09-23 ENCOUNTER — Telehealth: Payer: Self-pay | Admitting: Family Medicine

## 2022-09-23 ENCOUNTER — Ambulatory Visit: Payer: 59 | Admitting: Physician Assistant

## 2022-09-23 NOTE — Telephone Encounter (Signed)
Patient called in wanting to set up prenatal care, she states she has no idea how far along she is or when her last menstrual was.

## 2022-09-24 ENCOUNTER — Encounter: Payer: Self-pay | Admitting: Oncology

## 2022-09-24 NOTE — Telephone Encounter (Signed)
Called pt. Pt reports abnormal menstrual periods. Period lasts 4-5 days. Sometimes every month and sometimes every other month. Stopped birth control at some point approx Feb-March 2023. First UPT this morning. Pt believes she is [redacted] weeks pregnant. Has dating [place] scheduled at other office on 10/07/22 and desires to keep this appt. Will be scheduled with our office for nurse intake and New OB. Pt to call following dating [place] results.

## 2022-10-24 ENCOUNTER — Telehealth: Payer: 59

## 2022-10-24 ENCOUNTER — Telehealth: Payer: Self-pay | Admitting: Family Medicine

## 2022-10-24 NOTE — Telephone Encounter (Signed)
Patient No showed her nurse intake appointment today, so both appointments has been rescheduled to the next available

## 2022-11-01 ENCOUNTER — Encounter: Payer: Self-pay | Admitting: Family Medicine

## 2022-11-06 ENCOUNTER — Ambulatory Visit: Payer: 59 | Admitting: Physician Assistant

## 2022-12-03 ENCOUNTER — Telehealth (INDEPENDENT_AMBULATORY_CARE_PROVIDER_SITE_OTHER): Payer: 59

## 2022-12-03 DIAGNOSIS — Z3689 Encounter for other specified antenatal screening: Secondary | ICD-10-CM

## 2022-12-03 DIAGNOSIS — O0990 Supervision of high risk pregnancy, unspecified, unspecified trimester: Secondary | ICD-10-CM | POA: Insufficient documentation

## 2022-12-03 NOTE — Progress Notes (Signed)
New OB Intake  I connected with Alexis Mullins  on 12/03/22 at 11:15 AM EDT by MyChart Video Visit and verified that I am speaking with the correct person using two identifiers. Nurse is located at [facility] and pt is located at home .  I discussed the limitations, risks, security and privacy concerns of performing an evaluation and management service by telephone and the availability of in person appointments. I also discussed with the patient that there may be a patient responsible charge related to this service. The patient expressed understanding and agreed to proceed.  I explained I am completing New OB Intake today. We discussed EDD of 06/01/23 that is based on LMP of 08/25/22. Pt is G3/P2. I reviewed her allergies, medications, Medical/Surgical/OB history, and appropriate screenings. I informed her of [facility] services. [facility] information placed in AVS. Based on history, this is a high risk pregnancy.  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 Patient given information for [facility] Healthy Baby website for more information about [facility]. Patient is not interested in water birth. Offered upcoming OB visit with CNM to discuss further.  MyChart/Babyscripts MyChart access verified. I explained pt will have some visits in office and some virtually. Babyscripts instructions given and order placed. Patient verifies receipt of registration text/e-mail. Account successfully created and app downloaded.  Blood Pressure Cuff/Weight Scale Patient has private insurance; instructed to purchase blood  pressure cuff and bring to first prenatal appt. Explained after first prenatal appt pt will check weekly and document in Babyscripts. Patient does have weight scale.  Anatomy US Explained first scheduled [place] will be around 19 weeks. Anatomy US scheduled for 01/10/23 at 0145p. Pt notified to arrive at 0130p.  Labs Discussed Avelina Laine genetic screening with patient. Would like both Panorama and Horizon drawn at new OB visit. Routine prenatal labs needed.  COVID Vaccine Patient has not had COVID vaccine.   Is patient a CenteringPregnancy candidate?  Will discuss @ New OB       Is patient a Mom+Baby Combined Care candidate?  Not a candidate     Social Determinants of Health Food Insecurity: Patient denies food insecurity. WIC Referral: Patient is interested in referral to [facility].  Transportation: Patient denies transportation needs. Childcare: Discussed no children allowed at ultrasound appointments. Offered childcare services; patient declines childcare services at this time.  Interested in [place]? If yes, send referral and doula dot phrase.   First visit review I reviewed new OB appt with patient. Explained pt will be seen by Dorathy Kinsman, CNM at first visit; encounter routed to appropriate provider. Explained that patient will be seen by pregnancy navigator following visit with provider.   Henrietta Dine, CMA 12/03/2022  12:01 PM

## 2022-12-05 ENCOUNTER — Other Ambulatory Visit: Payer: Self-pay | Admitting: Hematology and Oncology

## 2022-12-05 DIAGNOSIS — D649 Anemia, unspecified: Secondary | ICD-10-CM

## 2022-12-05 NOTE — Progress Notes (Cosign Needed)
[facility]  [address] [place],  [place]  [address] [phone]  Clinic Day:  12/06/2022  Referring physician: Marianne Sofia, PA-C   HISTORY OF PRESENT ILLNESS:  The patient is a 28 y.o. female with iron deficiency anemia last seen by Dr. Melvyn Neth in March 2023, as her iron stores were adequate. The patient required iron infusions in May 2022.  She is referred back for concern for iron deficiency during pregnancy.  She is gravida 3 para 2 currently 15 weeks.  Her first child was premature due to placental abruption, and is now 27 years old.  Her second child died at 2 weeks due to blood incompatibility. She is on prenatal Gummies and tolerating these without difficulty.  She is being seen in a high risk prenatal clinic in [place].  She reports fatigue.  She denies any overt form of blood loss.  She denies pica to ice.  PHYSICAL EXAM:  Blood pressure 105/63, pulse 69, temperature 98.2 F (36.8 C), temperature source Oral, resp. rate 20, height 5\' 2"  (1.575 m), weight 132 lb 11.2 oz (60.2 kg), last menstrual period 08/25/2022, SpO2 100 %, unknown if currently breastfeeding. Wt Readings from Last 3 Encounters:  12/06/22 132 lb 11.2 oz (60.2 kg)  09/19/22 133 lb (60.3 kg)  05/07/22 124 lb (56.2 kg)   Body mass index is 24.27 kg/m.  Performance status (ECOG): 1 - Symptomatic but completely ambulatory  Physical Exam Vitals and nursing note reviewed.  Constitutional:      General: She is not in acute distress.    Appearance: Normal appearance.  HENT:     Head: Normocephalic and atraumatic.     Mouth/Throat:     Mouth: Mucous membranes are moist.     Pharynx: Oropharynx is clear. No oropharyngeal exudate or posterior oropharyngeal erythema.  Eyes:     General: No scleral icterus.    Extraocular Movements: Extraocular movements intact.     Conjunctiva/sclera: Conjunctivae normal.     Pupils: Pupils are equal, round, and reactive to light.   Cardiovascular:     Rate and Rhythm: Normal rate and regular rhythm.     Heart sounds: Normal heart sounds. No murmur heard.    No friction rub. No gallop.  Pulmonary:     Effort: Pulmonary effort is normal.     Breath sounds: Normal breath sounds. No wheezing, rhonchi or rales.  Abdominal:     General: There is no distension.     Palpations: Abdomen is soft. There is no mass.     Tenderness: There is no abdominal tenderness.  Musculoskeletal:        General: Normal range of motion.     Cervical back: Normal range of motion and neck supple. No tenderness.     Right lower leg: No edema.     Left lower leg: No edema.  Lymphadenopathy:     Cervical: No cervical adenopathy.     Upper Body:     Right upper body: No supraclavicular or axillary adenopathy.     Left upper body: No supraclavicular or axillary adenopathy.  Skin:    General: Skin is warm and dry.     Coloration: Skin is not jaundiced.     Findings: No rash.  Neurological:     Mental Status: She is alert and oriented to person, place, and time.     Cranial Nerves: No cranial nerve deficit.  Psychiatric:        Mood and Affect: Mood  normal.        Behavior: Behavior normal.        Thought Content: Thought content normal.    LABS:      Latest Ref Rng & Units 12/06/2022   12:00 AM 11/19/2021   12:00 AM 05/21/2021   12:00 AM  CBC  WBC  10.7     6.0     7.6      Hemoglobin 12.0 - 16.0 13.2     13.8     13.9      Hematocrit 36 - 46 38     41     41      Platelets 150 - 400 K/uL 178     140     172         This result is from an external source.      Latest Ref Rng & Units 12/12/2020    3:44 PM  CMP  Glucose 65 - 99 mg/dL 93   BUN 6 - 20 mg/dL 8   Creatinine 1.610.57 - 0.961.00 mg/dL 0.450.77   Sodium 409134 - 811144 mmol/L 142   Potassium 3.5 - 5.2 mmol/L 4.3   Chloride 96 - 106 mmol/L 103   CO2 20 - 29 mmol/L 23   Calcium 8.7 - 10.2 mg/dL 91.410.1   Total Protein 6.0 - 8.5 g/dL 7.2   Total Bilirubin 0.0 - 1.2 mg/dL 0.2    Alkaline Phos 44 - 121 IU/L 56   AST 0 - 40 IU/L 17   ALT 0 - 32 IU/L 18      No results found for: "CEA1", "CEA" / No results found for: "CEA1", "CEA" No results found for: "PSA1" No results found for: "[id number]" No results found for: "CAN125"  No results found for: "TOTALPROTELP", "ALBUMINELP", "A1GS", "A2GS", "BETS", "BETA2SER", "GAMS", "MSPIKE", "SPEI" Lab Results  Component Value Date   TIBC 425 11/19/2021   TIBC 454 (H) 05/21/2021   TIBC 474 (H) 02/07/2021   FERRITIN 43 11/19/2021   FERRITIN 74 05/21/2021   FERRITIN 67 02/07/2021   IRONPCTSAT 15 11/19/2021   IRONPCTSAT 22 05/21/2021   IRONPCTSAT 26 02/07/2021   No results found for: "LDH"  No results found for: "AFPTUMOR", "TOTALPROTELP", "ALBUMINELP", "A1GS", "A2GS", "BETS", "BETA2SER", "GAMS", "MSPIKE", "SPEI", "LDH", "CEA1", "CEA", "PSA1", "IGASERUM", "IGGSERUM", "IGMSERUM", "THGAB", "THYROGLB"  Review Flowsheet  More data exists      Latest Ref Rng & Units 02/07/2021 05/21/2021 11/19/2021  Oncology Labs  Ferritin 11 - 307 ng/mL 67  74  43   %SAT 10.4 - 31.8 % 26  22  15       STUDIES:    ASSESSMENT & PLAN:   Assessment/Plan:  27 y.o. female with history of iron deficiency anemia requiring IV iron.  She has been referred back for monitoring for recurrent iron deficiency during pregnancy.  Her hemoglobin and MCV are normal.  Iron studies are pending from today, so we will contact her with those results.  I will plan to see her back in 4 weeks for repeat clinical assessment.  The patient understands all the plans discussed today and is in agreement with them.  She knows to contact our office if she develops concerns prior to her next appointment    Adah PerlKelli A Vivan Agostino, PA-C

## 2022-12-06 ENCOUNTER — Inpatient Hospital Stay: Payer: 59 | Attending: Hematology and Oncology

## 2022-12-06 ENCOUNTER — Encounter: Payer: 59 | Admitting: Hematology and Oncology

## 2022-12-06 ENCOUNTER — Encounter: Payer: Self-pay | Admitting: Hematology and Oncology

## 2022-12-06 ENCOUNTER — Encounter: Payer: Self-pay | Admitting: Oncology

## 2022-12-06 ENCOUNTER — Inpatient Hospital Stay (INDEPENDENT_AMBULATORY_CARE_PROVIDER_SITE_OTHER): Payer: 59 | Admitting: Hematology and Oncology

## 2022-12-06 VITALS — BP 105/63 | HR 69 | Temp 98.2°F | Resp 20 | Ht 62.0 in | Wt 132.7 lb

## 2022-12-06 DIAGNOSIS — D509 Iron deficiency anemia, unspecified: Secondary | ICD-10-CM | POA: Insufficient documentation

## 2022-12-06 DIAGNOSIS — Z8639 Personal history of other endocrine, nutritional and metabolic disease: Secondary | ICD-10-CM | POA: Diagnosis not present

## 2022-12-06 DIAGNOSIS — D649 Anemia, unspecified: Secondary | ICD-10-CM

## 2022-12-06 LAB — CBC AND DIFFERENTIAL
HCT: 38 (ref 36–46)
Hemoglobin: 13.2 (ref 12.0–16.0)
MCV: 86 (ref 81–99)
Neutrophils Absolute: 8.13
Platelets: 178 10*3/uL (ref 150–400)
WBC: 10.7

## 2022-12-06 LAB — FOLATE: Folate: 18.1 ng/mL (ref >5.9)

## 2022-12-06 LAB — IRON AND TIBC
Iron: 85 ug/dL (ref 28–170)
Saturation Ratios: 17 % (ref 10.4–31.8)
TIBC: 514 ug/dL — ABNORMAL HIGH (ref 250–450)
UIBC: 429 ug/dL

## 2022-12-06 LAB — CBC: RBC: 4.4 (ref 3.87–5.11)

## 2022-12-06 LAB — FERRITIN: Ferritin: 12 ng/mL (ref 11–307)

## 2022-12-06 LAB — VITAMIN B12: Vitamin B-12: 254 pg/mL (ref 180–914)

## 2022-12-09 ENCOUNTER — Telehealth: Payer: Self-pay

## 2022-12-09 ENCOUNTER — Encounter: Payer: Self-pay | Admitting: Hematology and Oncology

## 2022-12-09 ENCOUNTER — Encounter: Payer: 59 | Admitting: Advanced Practice Midwife

## 2022-12-09 NOTE — Telephone Encounter (Signed)
Patient notified and voiced understanding.

## 2022-12-09 NOTE — Telephone Encounter (Signed)
-----   Message from Adah Perl, PA-C sent at 12/09/2022 11:02 AM EDT ----- Do you mind calling her and letting her know we can give her IV iron on Friday at 2 pm? We had Thurs open, then she wanted Fri. Thanks

## 2022-12-11 ENCOUNTER — Encounter: Payer: Self-pay | Admitting: Hematology and Oncology

## 2022-12-11 ENCOUNTER — Encounter: Payer: 59 | Admitting: Advanced Practice Midwife

## 2022-12-12 ENCOUNTER — Encounter: Payer: Self-pay | Admitting: Hematology and Oncology

## 2022-12-12 ENCOUNTER — Ambulatory Visit: Payer: 59

## 2022-12-12 MED FILL — Iron Sucrose Inj 20 MG/ML (Fe Equiv): INTRAVENOUS | Qty: 10 | Status: AC

## 2022-12-13 ENCOUNTER — Inpatient Hospital Stay: Payer: 59

## 2022-12-13 VITALS — BP 125/59 | HR 78 | Temp 98.0°F | Resp 18 | Wt 132.0 lb

## 2022-12-13 DIAGNOSIS — D508 Other iron deficiency anemias: Secondary | ICD-10-CM

## 2022-12-13 DIAGNOSIS — D509 Iron deficiency anemia, unspecified: Secondary | ICD-10-CM | POA: Diagnosis not present

## 2022-12-13 MED ORDER — CYANOCOBALAMIN 1000 MCG/ML IJ SOLN
1000.0000 ug | Freq: Once | INTRAMUSCULAR | Status: AC
  Administered 2022-12-13: 1000 ug via INTRAMUSCULAR
  Filled 2022-12-13: qty 1

## 2022-12-13 MED ORDER — SODIUM CHLORIDE 0.9 % IV SOLN
200.0000 mg | Freq: Once | INTRAVENOUS | Status: AC
  Administered 2022-12-13: 200 mg via INTRAVENOUS
  Filled 2022-12-13: qty 200

## 2022-12-13 MED ORDER — SODIUM CHLORIDE 0.9 % IV SOLN: Freq: Once | INTRAVENOUS | Status: AC

## 2022-12-13 NOTE — Patient Instructions (Signed)

## 2022-12-17 MED FILL — Iron Sucrose Inj 20 MG/ML (Fe Equiv): INTRAVENOUS | Qty: 10 | Status: AC

## 2022-12-18 ENCOUNTER — Inpatient Hospital Stay: Payer: 59

## 2022-12-18 VITALS — BP 103/63 | HR 75 | Temp 98.1°F | Resp 18

## 2022-12-18 DIAGNOSIS — D508 Other iron deficiency anemias: Secondary | ICD-10-CM

## 2022-12-18 DIAGNOSIS — D509 Iron deficiency anemia, unspecified: Secondary | ICD-10-CM | POA: Diagnosis not present

## 2022-12-18 MED ORDER — SODIUM CHLORIDE 0.9 % IV SOLN
200.0000 mg | Freq: Once | INTRAVENOUS | Status: AC
  Administered 2022-12-18: 200 mg via INTRAVENOUS
  Filled 2022-12-18: qty 200

## 2022-12-18 MED ORDER — SODIUM CHLORIDE 0.9 % IV SOLN: Freq: Once | INTRAVENOUS | Status: AC

## 2022-12-18 NOTE — Patient Instructions (Signed)

## 2022-12-19 MED FILL — Iron Sucrose Inj 20 MG/ML (Fe Equiv): INTRAVENOUS | Qty: 10 | Status: AC

## 2022-12-20 ENCOUNTER — Inpatient Hospital Stay: Payer: 59

## 2022-12-20 VITALS — BP 108/59 | HR 78 | Temp 98.0°F | Resp 18 | Wt 133.0 lb

## 2022-12-20 DIAGNOSIS — D509 Iron deficiency anemia, unspecified: Secondary | ICD-10-CM | POA: Diagnosis not present

## 2022-12-20 DIAGNOSIS — D508 Other iron deficiency anemias: Secondary | ICD-10-CM

## 2022-12-20 MED ORDER — SODIUM CHLORIDE 0.9 % IV SOLN
200.0000 mg | Freq: Once | INTRAVENOUS | Status: AC
  Administered 2022-12-20: 200 mg via INTRAVENOUS
  Filled 2022-12-20: qty 200

## 2022-12-20 MED ORDER — SODIUM CHLORIDE 0.9 % IV SOLN: Freq: Once | INTRAVENOUS | Status: AC

## 2022-12-20 MED FILL — Iron Sucrose Inj 20 MG/ML (Fe Equiv): INTRAVENOUS | Qty: 10 | Status: AC

## 2022-12-20 NOTE — Patient Instructions (Signed)

## 2022-12-23 ENCOUNTER — Inpatient Hospital Stay: Payer: 59

## 2022-12-23 VITALS — BP 109/56 | HR 99 | Temp 98.0°F | Resp 18

## 2022-12-23 DIAGNOSIS — D509 Iron deficiency anemia, unspecified: Secondary | ICD-10-CM | POA: Diagnosis not present

## 2022-12-23 DIAGNOSIS — D508 Other iron deficiency anemias: Secondary | ICD-10-CM

## 2022-12-23 MED ORDER — SODIUM CHLORIDE 0.9 % IV SOLN
200.0000 mg | Freq: Once | INTRAVENOUS | Status: AC
  Administered 2022-12-23: 200 mg via INTRAVENOUS
  Filled 2022-12-23: qty 200

## 2022-12-23 MED ORDER — SODIUM CHLORIDE 0.9 % IV SOLN: Freq: Once | INTRAVENOUS | Status: AC

## 2022-12-23 NOTE — Patient Instructions (Signed)

## 2022-12-24 MED FILL — Iron Sucrose Inj 20 MG/ML (Fe Equiv): INTRAVENOUS | Qty: 10 | Status: AC

## 2022-12-25 ENCOUNTER — Inpatient Hospital Stay: Payer: 59 | Attending: Hematology and Oncology

## 2022-12-25 VITALS — BP 103/56 | HR 84 | Temp 98.0°F | Resp 18

## 2022-12-25 DIAGNOSIS — Z3A20 20 weeks gestation of pregnancy: Secondary | ICD-10-CM | POA: Insufficient documentation

## 2022-12-25 DIAGNOSIS — D508 Other iron deficiency anemias: Secondary | ICD-10-CM

## 2022-12-25 DIAGNOSIS — D509 Iron deficiency anemia, unspecified: Secondary | ICD-10-CM | POA: Insufficient documentation

## 2022-12-25 DIAGNOSIS — O99012 Anemia complicating pregnancy, second trimester: Secondary | ICD-10-CM | POA: Diagnosis present

## 2022-12-25 MED ORDER — SODIUM CHLORIDE 0.9 % IV SOLN
200.0000 mg | Freq: Once | INTRAVENOUS | Status: AC
  Administered 2022-12-25: 200 mg via INTRAVENOUS
  Filled 2022-12-25: qty 200

## 2022-12-25 MED ORDER — SODIUM CHLORIDE 0.9 % IV SOLN: Freq: Once | INTRAVENOUS | Status: AC

## 2023-01-01 ENCOUNTER — Other Ambulatory Visit: Payer: Self-pay | Admitting: Hematology and Oncology

## 2023-01-01 DIAGNOSIS — D508 Other iron deficiency anemias: Secondary | ICD-10-CM

## 2023-01-02 NOTE — Progress Notes (Deleted)
[facility]  [address] [place],  [place]  [address] [phone]  Clinic Day:  01/02/2023  Referring physician: Marianne Sofia, PA-C   HISTORY OF PRESENT ILLNESS:  The patient is a 27 y.o. female with iron deficiency anemia.  She received IV iron in the form of Venofer in May 2022 PHYSICAL EXAM:  Last menstrual period 08/25/2022, unknown if currently breastfeeding. Wt Readings from Last 3 Encounters:  12/20/22 133 lb (60.3 kg)  12/13/22 132 lb (59.9 kg)  12/06/22 132 lb 11.2 oz (60.2 kg)   There is no height or weight on file to calculate BMI.  Performance status (ECOG): {CHL ONC [id number]  Physical Exam  LABS:      Latest Ref Rng & Units 12/06/2022   12:00 AM 11/19/2021   12:00 AM 05/21/2021   12:00 AM  CBC  WBC  10.7     6.0     7.6      Hemoglobin 12.0 - 16.0 13.2     13.8     13.9      Hematocrit 36 - 46 38     41     41      Platelets 150 - 400 K/uL 178     140     172         This result is from an external source.      Latest Ref Rng & Units 12/12/2020    3:44 PM  CMP  Glucose 65 - 99 mg/dL 93   BUN 6 - 20 mg/dL 8   Creatinine 9.14 - 7.82 mg/dL 9.56   Sodium 213 - 086 mmol/L 142   Potassium 3.5 - 5.2 mmol/L 4.3   Chloride 96 - 106 mmol/L 103   CO2 20 - 29 mmol/L 23   Calcium 8.7 - 10.2 mg/dL 57.8   Total Protein 6.0 - 8.5 g/dL 7.2   Total Bilirubin 0.0 - 1.2 mg/dL 0.2   Alkaline Phos 44 - 121 IU/L 56   AST 0 - 40 IU/L 17   ALT 0 - 32 IU/L 18      No results found for: "CEA1", "CEA" / No results found for: "CEA1", "CEA" No results found for: "PSA1" No results found for: "ION629" No results found for: "CAN125"  No results found for: "TOTALPROTELP", "ALBUMINELP", "A1GS", "A2GS", "BETS", "BETA2SER", "GAMS", "MSPIKE", "SPEI" Lab Results  Component Value Date   TIBC 514 (H) 12/06/2022   TIBC 425 11/19/2021   TIBC 454 (H) 05/21/2021   FERRITIN 12 12/06/2022   FERRITIN 43 11/19/2021   FERRITIN 74 05/21/2021    IRONPCTSAT 17 12/06/2022   IRONPCTSAT 15 11/19/2021   IRONPCTSAT 22 05/21/2021   No results found for: "LDH"  No results found for: "AFPTUMOR", "TOTALPROTELP", "ALBUMINELP", "A1GS", "A2GS", "BETS", "BETA2SER", "GAMS", "MSPIKE", "SPEI", "LDH", "CEA1", "CEA", "PSA1", "IGASERUM", "IGGSERUM", "IGMSERUM", "THGAB", "THYROGLB"  Review Flowsheet  More data exists      Latest Ref Rng & Units 05/21/2021 11/19/2021 12/06/2022  Oncology Labs  Ferritin 11 - 307 ng/mL 74  43  12   %SAT 10.4 - 31.8 % 22  15  17       STUDIES:  No results found.    ASSESSMENT & PLAN:   Assessment/Plan:  27 y.o. female with ***  The patient understands all the plans discussed today and is in agreement with them.  She knows to contact our office if she develops concerns prior to her next appointment    [facility]  A Adalynne Steffensmeier, PA-C

## 2023-01-03 ENCOUNTER — Inpatient Hospital Stay: Payer: 59 | Admitting: Hematology and Oncology

## 2023-01-03 ENCOUNTER — Inpatient Hospital Stay: Payer: 59

## 2023-01-09 ENCOUNTER — Encounter: Payer: Self-pay | Admitting: *Deleted

## 2023-01-10 ENCOUNTER — Other Ambulatory Visit: Payer: 59

## 2023-01-10 ENCOUNTER — Encounter: Payer: Self-pay | Admitting: Hematology and Oncology

## 2023-01-10 ENCOUNTER — Ambulatory Visit: Payer: 59 | Attending: Physician Assistant

## 2023-01-13 ENCOUNTER — Inpatient Hospital Stay: Payer: 59

## 2023-01-13 VITALS — BP 106/58 | HR 80 | Temp 98.1°F | Resp 16 | Ht 62.0 in | Wt 133.0 lb

## 2023-01-13 DIAGNOSIS — O99012 Anemia complicating pregnancy, second trimester: Secondary | ICD-10-CM | POA: Diagnosis not present

## 2023-01-13 DIAGNOSIS — D508 Other iron deficiency anemias: Secondary | ICD-10-CM

## 2023-01-13 MED ORDER — CYANOCOBALAMIN 1000 MCG/ML IJ SOLN
1000.0000 ug | Freq: Once | INTRAMUSCULAR | Status: AC
  Administered 2023-01-13: 1000 ug via INTRAMUSCULAR
  Filled 2023-01-13: qty 1

## 2023-01-16 ENCOUNTER — Inpatient Hospital Stay: Payer: 59

## 2023-01-16 ENCOUNTER — Encounter: Payer: Self-pay | Admitting: Hematology and Oncology

## 2023-01-16 ENCOUNTER — Inpatient Hospital Stay (INDEPENDENT_AMBULATORY_CARE_PROVIDER_SITE_OTHER): Payer: 59 | Admitting: Hematology and Oncology

## 2023-01-16 VITALS — BP 105/58 | HR 67 | Temp 98.2°F | Resp 18 | Ht 62.0 in | Wt 132.2 lb

## 2023-01-16 DIAGNOSIS — D508 Other iron deficiency anemias: Secondary | ICD-10-CM | POA: Diagnosis not present

## 2023-01-16 DIAGNOSIS — O99012 Anemia complicating pregnancy, second trimester: Secondary | ICD-10-CM | POA: Diagnosis not present

## 2023-01-16 DIAGNOSIS — E538 Deficiency of other specified B group vitamins: Secondary | ICD-10-CM

## 2023-01-16 LAB — CBC WITH DIFFERENTIAL (CANCER CENTER ONLY)
Abs Immature Granulocytes: 0.05 10*3/uL (ref 0.00–0.07)
Basophils Absolute: 0 10*3/uL (ref 0.0–0.1)
Basophils Relative: 0 %
Eosinophils Absolute: 0.2 10*3/uL (ref 0.0–0.5)
Eosinophils Relative: 2 %
HCT: 39.9 % (ref 36.0–46.0)
Hemoglobin: 13.3 g/dL (ref 12.0–15.0)
Immature Granulocytes: 1 %
Lymphocytes Relative: 15 %
Lymphs Abs: 1.7 10*3/uL (ref 0.7–4.0)
MCH: 30.5 pg (ref 26.0–34.0)
MCHC: 33.3 g/dL (ref 30.0–36.0)
MCV: 91.5 fL (ref 80.0–100.0)
Monocytes Absolute: 0.3 10*3/uL (ref 0.1–1.0)
Monocytes Relative: 3 %
Neutro Abs: 8.7 10*3/uL — ABNORMAL HIGH (ref 1.7–7.7)
Neutrophils Relative %: 79 %
Platelet Count: 173 10*3/uL (ref 150–400)
RBC: 4.36 MIL/uL (ref 3.87–5.11)
RDW: 13.5 % (ref 11.5–15.5)
WBC Count: 10.9 10*3/uL — ABNORMAL HIGH (ref 4.0–10.5)
nRBC: 0 % (ref 0.0–0.2)

## 2023-01-16 LAB — IRON AND TIBC
Iron: 130 ug/dL (ref 28–170)
Saturation Ratios: 32 % — ABNORMAL HIGH (ref 10.4–31.8)
TIBC: 410 ug/dL (ref 250–450)
UIBC: 280 ug/dL

## 2023-01-16 LAB — FERRITIN: Ferritin: 144 ng/mL (ref 11–307)

## 2023-01-16 LAB — VITAMIN B12: Vitamin B-12: 476 pg/mL (ref 180–914)

## 2023-01-16 NOTE — Progress Notes (Cosign Needed)
[facility]  [address] [place],  [place]  [address] [phone]  Clinic Day:  01/16/2023  Referring physician: Marianne Sofia, PA-C   HISTORY OF PRESENT ILLNESS:  The patient is a 27 y.o. female with iron deficiency anemia during pregnancy.  She comes in today to reassess her iron stores after receiving Venofer in April.  Her B12 was low normal, so she was also started on B12 injections monthly.  She is gravida 3 para 2 currently 20 weeks. She is on prenatal Gummies and tolerating these without difficulty.  She is being seen in a high risk prenatal clinic in [place] every 2 weeks.  She reports mild suprapubic cramping.  She has frequency of urination, but is drinking extra fluid.  She denies dysuria or hematuria.  She is an appointment with high risk clinic tomorrow.  PHYSICAL EXAM:  Blood pressure (!) 105/58, pulse 67, temperature 98.2 F (36.8 C), temperature source Oral, resp. rate 18, height 5\' 2"  (1.575 m), weight 132 lb 3.2 oz (60 kg), last menstrual period 08/25/2022, SpO2 100 %, unknown if currently breastfeeding. Wt Readings from Last 3 Encounters:  01/16/23 132 lb 3.2 oz (60 kg)  01/13/23 133 lb (60.3 kg)  12/20/22 133 lb (60.3 kg)   Body mass index is 24.18 kg/m.  Performance status (ECOG): 1 - Symptomatic but completely ambulatory  Physical Exam Vitals and nursing note reviewed.  Constitutional:      General: She is not in acute distress.    Appearance: Normal appearance.  HENT:     Head: Normocephalic and atraumatic.     Mouth/Throat:     Mouth: Mucous membranes are moist.     Pharynx: Oropharynx is clear. No oropharyngeal exudate or posterior oropharyngeal erythema.  Eyes:     General: No scleral icterus.    Extraocular Movements: Extraocular movements intact.     Conjunctiva/sclera: Conjunctivae normal.     Pupils: Pupils are equal, round, and reactive to light.  Cardiovascular:     Rate and Rhythm: Normal rate and  regular rhythm.     Heart sounds: Normal heart sounds. No murmur heard.    No friction rub. No gallop.  Pulmonary:     Effort: Pulmonary effort is normal.     Breath sounds: Normal breath sounds. No wheezing, rhonchi or rales.  Abdominal:     General: There is no distension.     Palpations: Abdomen is soft. There is no hepatomegaly, splenomegaly or mass.     Tenderness: There is no abdominal tenderness.  Musculoskeletal:        General: Normal range of motion.     Cervical back: Normal range of motion and neck supple. No tenderness.     Right lower leg: No edema.     Left lower leg: No edema.  Lymphadenopathy:     Cervical: No cervical adenopathy.     Upper Body:     Right upper body: No supraclavicular or axillary adenopathy.     Left upper body: No supraclavicular or axillary adenopathy.     Lower Body: No right inguinal adenopathy. No left inguinal adenopathy.  Skin:    General: Skin is warm and dry.     Coloration: Skin is not jaundiced.     Findings: No rash.  Neurological:     Mental Status: She is alert and oriented to person, place, and time.     Cranial Nerves: No cranial nerve deficit.  Psychiatric:  Mood and Affect: Mood normal.        Behavior: Behavior normal.        Thought Content: Thought content normal.     LABS:      Latest Ref Rng & Units 01/16/2023    3:04 PM 12/06/2022   12:00 AM 11/19/2021   12:00 AM  CBC  WBC 4.0 - 10.5 K/uL 10.9  10.7     6.0      Hemoglobin 12.0 - 15.0 g/dL 09.8  11.9     14.7      Hematocrit 36.0 - 46.0 % 39.9  38     41      Platelets 150 - 400 K/uL 173  178     140         This result is from an external source.      Latest Ref Rng & Units 12/12/2020    3:44 PM  CMP  Glucose 65 - 99 mg/dL 93   BUN 6 - 20 mg/dL 8   Creatinine 8.29 - 5.62 mg/dL 1.30   Sodium 865 - 784 mmol/L 142   Potassium 3.5 - 5.2 mmol/L 4.3   Chloride 96 - 106 mmol/L 103   CO2 20 - 29 mmol/L 23   Calcium 8.7 - 10.2 mg/dL 69.6   Total  Protein 6.0 - 8.5 g/dL 7.2   Total Bilirubin 0.0 - 1.2 mg/dL 0.2   Alkaline Phos 44 - 121 IU/L 56   AST 0 - 40 IU/L 17   ALT 0 - 32 IU/L 18     Lab Results  Component Value Date   TIBC 410 01/16/2023   TIBC 514 (H) 12/06/2022   TIBC 425 11/19/2021   FERRITIN 144 01/16/2023   FERRITIN 12 12/06/2022   FERRITIN 43 11/19/2021   IRONPCTSAT 32 (H) 01/16/2023   IRONPCTSAT 17 12/06/2022   IRONPCTSAT 15 11/19/2021    Review Flowsheet  More data exists      Latest Ref Rng & Units 11/19/2021 12/06/2022 01/16/2023  Oncology Labs  Ferritin 11 - 307 ng/mL 43  12  144   %SAT 10.4 - 31.8 % 15  17  32      STUDIES:  No results found.    ASSESSMENT & PLAN:   Assessment/Plan:  27 y.o. female with iron deficiency in pregnancy, as well as B12 deficiency.  Her iron stores are adequate and B12 normal today.  She will continue B12 monthly.  She is having some suprapubic cramping and is seeing high risk clinic tomorrow.  She was unable to give a urine specimen to check for infection.  I will plan to see her back in 4 weeks for repeat clinical assessment.  The patient understands all the plans discussed today and is in agreement with them.  She knows to contact our office if she develops concerns prior to her next appointment.     Adah Perl, PA-C   Physician Assistant [facility] [place] [phone]

## 2023-01-17 ENCOUNTER — Encounter: Payer: Self-pay | Admitting: Hematology and Oncology

## 2023-01-17 ENCOUNTER — Telehealth: Payer: Self-pay | Admitting: Hematology and Oncology

## 2023-01-17 ENCOUNTER — Telehealth: Payer: Self-pay

## 2023-01-17 NOTE — Telephone Encounter (Signed)
-----   Message from Adah Perl, PA-C sent at 01/17/2023  8:28 AM EDT ----- Please let her know her iron and B12 levels are great. Thanks

## 2023-01-17 NOTE — Telephone Encounter (Signed)
Pt here for clinic visit yesterday. Schedulers called this am to make f/u appt & pt asked if lab results were back. Message sent to [facility].

## 2023-01-17 NOTE — Telephone Encounter (Signed)
Patient notified and voiced understanding.

## 2023-01-17 NOTE — Telephone Encounter (Signed)
Patient has been scheduled. Aware of appt date and time    Scheduling Message Entered by Belva Crome A on 01/16/2023 at  3:37 PM Priority: Routine <No visit type provided>  Department: [facility]  Provider:  Scheduling Notes:  Labs and f/u with [facility] June 18 or 19

## 2023-01-22 ENCOUNTER — Encounter: Payer: Self-pay | Admitting: Hematology and Oncology

## 2023-02-11 ENCOUNTER — Inpatient Hospital Stay: Payer: 59

## 2023-02-11 ENCOUNTER — Ambulatory Visit: Payer: 59 | Admitting: Hematology and Oncology

## 2023-02-13 ENCOUNTER — Inpatient Hospital Stay: Payer: 59 | Attending: Hematology and Oncology

## 2023-03-14 ENCOUNTER — Encounter: Payer: Self-pay | Admitting: Hematology and Oncology

## 2023-03-17 ENCOUNTER — Ambulatory Visit: Payer: 59

## 2023-04-17 ENCOUNTER — Ambulatory Visit: Payer: 59

## 2023-07-10 ENCOUNTER — Encounter: Payer: Self-pay | Admitting: Hematology and Oncology
